# Patient Record
Sex: Female | Born: 1985 | Race: Black or African American | Hispanic: No | Smoking: Former smoker
Health system: Southern US, Community
[De-identification: ages and names within clinical notes are randomized; demographics above are authoritative.]

## PROBLEM LIST (undated history)

## (undated) ENCOUNTER — Ambulatory Visit: Admission: EM | Source: Home / Self Care

## (undated) DIAGNOSIS — G43909 Migraine, unspecified, not intractable, without status migrainosus: Secondary | ICD-10-CM

## (undated) DIAGNOSIS — M797 Fibromyalgia: Secondary | ICD-10-CM

## (undated) DIAGNOSIS — F419 Anxiety disorder, unspecified: Secondary | ICD-10-CM

## (undated) HISTORY — PX: ABDOMINAL SURGERY: SHX537

---

## 2006-06-30 ENCOUNTER — Emergency Department: Payer: Self-pay | Admitting: Emergency Medicine

## 2006-07-19 ENCOUNTER — Emergency Department: Payer: Self-pay | Admitting: Emergency Medicine

## 2006-10-29 ENCOUNTER — Emergency Department: Payer: Self-pay | Admitting: Emergency Medicine

## 2007-05-27 ENCOUNTER — Emergency Department: Payer: Self-pay | Admitting: Emergency Medicine

## 2007-05-30 ENCOUNTER — Emergency Department: Payer: Self-pay | Admitting: Unknown Physician Specialty

## 2007-05-31 ENCOUNTER — Emergency Department: Payer: Self-pay | Admitting: Emergency Medicine

## 2007-06-21 ENCOUNTER — Emergency Department: Payer: Self-pay | Admitting: Emergency Medicine

## 2007-06-22 ENCOUNTER — Emergency Department: Payer: Self-pay

## 2007-09-13 ENCOUNTER — Emergency Department: Payer: Self-pay | Admitting: Emergency Medicine

## 2007-09-19 ENCOUNTER — Emergency Department: Payer: Self-pay | Admitting: Emergency Medicine

## 2007-12-15 ENCOUNTER — Emergency Department: Payer: Self-pay | Admitting: Emergency Medicine

## 2008-11-05 ENCOUNTER — Emergency Department: Payer: Self-pay | Admitting: Emergency Medicine

## 2008-11-23 ENCOUNTER — Emergency Department: Payer: Self-pay | Admitting: Emergency Medicine

## 2009-07-01 ENCOUNTER — Emergency Department: Payer: Self-pay | Admitting: Emergency Medicine

## 2009-08-21 ENCOUNTER — Emergency Department: Payer: Self-pay | Admitting: Unknown Physician Specialty

## 2009-10-15 ENCOUNTER — Emergency Department: Payer: Self-pay | Admitting: Unknown Physician Specialty

## 2010-02-17 ENCOUNTER — Emergency Department: Payer: Self-pay | Admitting: Emergency Medicine

## 2010-10-24 ENCOUNTER — Emergency Department: Payer: Self-pay | Admitting: Emergency Medicine

## 2011-04-20 DIAGNOSIS — J45909 Unspecified asthma, uncomplicated: Secondary | ICD-10-CM | POA: Insufficient documentation

## 2011-04-20 DIAGNOSIS — Z9851 Tubal ligation status: Secondary | ICD-10-CM | POA: Insufficient documentation

## 2012-06-20 DIAGNOSIS — F17200 Nicotine dependence, unspecified, uncomplicated: Secondary | ICD-10-CM | POA: Insufficient documentation

## 2012-07-21 DIAGNOSIS — F410 Panic disorder [episodic paroxysmal anxiety] without agoraphobia: Secondary | ICD-10-CM | POA: Insufficient documentation

## 2012-08-04 DIAGNOSIS — K5900 Constipation, unspecified: Secondary | ICD-10-CM | POA: Insufficient documentation

## 2013-01-29 DIAGNOSIS — F988 Other specified behavioral and emotional disorders with onset usually occurring in childhood and adolescence: Secondary | ICD-10-CM | POA: Insufficient documentation

## 2013-02-02 DIAGNOSIS — K089 Disorder of teeth and supporting structures, unspecified: Secondary | ICD-10-CM | POA: Insufficient documentation

## 2013-05-25 DIAGNOSIS — F419 Anxiety disorder, unspecified: Secondary | ICD-10-CM | POA: Insufficient documentation

## 2013-05-25 DIAGNOSIS — F331 Major depressive disorder, recurrent, moderate: Secondary | ICD-10-CM | POA: Insufficient documentation

## 2013-05-25 DIAGNOSIS — F411 Generalized anxiety disorder: Secondary | ICD-10-CM | POA: Insufficient documentation

## 2013-07-19 DIAGNOSIS — J309 Allergic rhinitis, unspecified: Secondary | ICD-10-CM | POA: Insufficient documentation

## 2013-07-25 DIAGNOSIS — R87613 High grade squamous intraepithelial lesion on cytologic smear of cervix (HGSIL): Secondary | ICD-10-CM | POA: Insufficient documentation

## 2016-06-06 DIAGNOSIS — F410 Panic disorder [episodic paroxysmal anxiety] without agoraphobia: Secondary | ICD-10-CM | POA: Insufficient documentation

## 2017-10-14 DIAGNOSIS — D219 Benign neoplasm of connective and other soft tissue, unspecified: Secondary | ICD-10-CM | POA: Insufficient documentation

## 2017-10-14 DIAGNOSIS — R102 Pelvic and perineal pain: Secondary | ICD-10-CM | POA: Insufficient documentation

## 2018-05-10 ENCOUNTER — Other Ambulatory Visit: Payer: Self-pay

## 2018-05-10 ENCOUNTER — Encounter: Payer: Self-pay | Admitting: Emergency Medicine

## 2018-05-10 ENCOUNTER — Ambulatory Visit
Admission: EM | Admit: 2018-05-10 | Discharge: 2018-05-10 | Disposition: A | Discharge status: Home / Self Care | Payer: Medicaid Other | Attending: Emergency Medicine | Admitting: Emergency Medicine

## 2018-05-10 ENCOUNTER — Ambulatory Visit: Payer: Medicaid Other

## 2018-05-10 DIAGNOSIS — Z79899 Other long term (current) drug therapy: Secondary | ICD-10-CM | POA: Insufficient documentation

## 2018-05-10 DIAGNOSIS — S7001XA Contusion of right hip, initial encounter: Secondary | ICD-10-CM | POA: Diagnosis not present

## 2018-05-10 DIAGNOSIS — M7989 Other specified soft tissue disorders: Secondary | ICD-10-CM | POA: Diagnosis present

## 2018-05-10 DIAGNOSIS — F17210 Nicotine dependence, cigarettes, uncomplicated: Secondary | ICD-10-CM | POA: Insufficient documentation

## 2018-05-10 DIAGNOSIS — Z23 Encounter for immunization: Secondary | ICD-10-CM

## 2018-05-10 DIAGNOSIS — M25551 Pain in right hip: Secondary | ICD-10-CM | POA: Diagnosis present

## 2018-05-10 DIAGNOSIS — X58XXXA Exposure to other specified factors, initial encounter: Secondary | ICD-10-CM

## 2018-05-10 HISTORY — DX: Migraine, unspecified, not intractable, without status migrainosus: G43.909

## 2018-05-10 HISTORY — DX: Anxiety disorder, unspecified: F41.9

## 2018-05-10 MED ORDER — ACETAMINOPHEN 500 MG PO TABS
1000.0000 mg | ORAL_TABLET | Freq: Once | ORAL | Status: AC
  Administered 2018-05-10: 1000 mg via ORAL

## 2018-05-10 MED ORDER — KETOROLAC TROMETHAMINE 60 MG/2ML IM SOLN
30.0000 mg | Freq: Once | INTRAMUSCULAR | Status: AC
  Administered 2018-05-10: 30 mg via INTRAMUSCULAR

## 2018-05-10 MED ORDER — CEPHALEXIN 500 MG PO CAPS: 1000.0000 mg | ORAL_CAPSULE | Freq: Two times a day (BID) | ORAL | 0 refills | Status: AC

## 2018-05-10 MED ORDER — HYDROCODONE-ACETAMINOPHEN 5-325 MG PO TABS: 1.0000 | ORAL_TABLET | Freq: Four times a day (QID) | ORAL | 0 refills | Status: DC | PRN

## 2018-05-10 MED ORDER — IBUPROFEN 600 MG PO TABS: 600.0000 mg | ORAL_TABLET | Freq: Four times a day (QID) | ORAL | 0 refills | Status: DC | PRN

## 2018-05-10 MED ORDER — MUPIROCIN 2 % EX OINT: 1.0000 "application " | TOPICAL_OINTMENT | Freq: Three times a day (TID) | CUTANEOUS | 0 refills | Status: DC

## 2018-05-10 MED ORDER — TETANUS-DIPHTH-ACELL PERTUSSIS 5-2.5-18.5 LF-MCG/0.5 IM SUSP
0.5000 mL | Freq: Once | INTRAMUSCULAR | Status: AC
  Administered 2018-05-10: 0.5 mL via INTRAMUSCULAR

## 2018-05-10 NOTE — Discharge Instructions (Addendum)
Your x-ray was negative for fracture. This may hurt for some time.  Go immediately to the ER for fevers above 100.4, for pain not controlled with the Tylenol/ibuprofen or Norco/ibuprofen combination, or for any other concerns.  Keflex and Bactroban will help with any skin infection.  600 mg of ibuprofen combined with a Tylenol containing product together 3 or 4 times a day as needed for pain.  Can do 600 mg of ibuprofen combined with the thousand milligrams, or 2 extra strength Tylenols.  Or, you may take the 600 mg of ibuprofen with 1 or 2 of the Norco/Vicodin.  Do not take the Tylenol and Vicodin is to both of Tylenol in them.   Here is a list of primary care providers who are taking new patients:  Dr. Elizabeth Sauer, Dr. Schuyler Amor [address] [place] [address] [phone]  [facility] [address] [place] [address]  [phone]  [facility] [address] [place], [place] [address] [phone]  [facility] [address] [place]  [phone] [place], [place] [address]  Here are clinics/ other resources who will see you if you do not have insurance. Some have certain criteria that you must meet. Call them and find out what they are:  [facility]: [address], [place], [place] [address] Phone: [phone] Hours: First and Third Saturdays of each Month, 9 a.m. - 1 p.m.  [facility]: [address], [address] [place], [place] [address] Phone: [phone] Hours: Tuesday, 4 p.m. - 8 p.m. Thursday, 1 p.m. - 8 p.m. Wednesday, 9 a.m. - [facility] [address], [place], [place] [address] Phone: [phone] Pharmacy Phone Number: [phone] Dental Phone Number: [phone] [facility] Insurance Help: [phone]  Dental Hours: Monday - Thursday, 8 a.m. - 6 p.m.  [facility] [address] [place], [place] [address] Phone: [phone] Pharmacy Phone  Number: [phone] [facility] Insurance Help: [phone]  [facility] [address] [place] [place], [place] [address] Phone: [phone] Pharmacy Phone Number: [phone] [facility] Insurance Help: [phone]  [facility] [address] [place], [place] [address] Phone: [phone] [facility] Insurance Help: [phone]   [facility] [address], [place], [place] [address] Phone: [phone]  Go to www.goodrx.com to look up your medications. This will give you a list of where you can find your prescriptions at the most affordable prices. Or ask the pharmacist what the cash price is, or if they have any other discount programs available to help make your medication more affordable. This can be less expensive than what you would pay with insurance.
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
## 2019-01-20 ENCOUNTER — Other Ambulatory Visit: Payer: Self-pay

## 2019-01-20 ENCOUNTER — Ambulatory Visit: Payer: Medicaid Other

## 2019-01-20 ENCOUNTER — Ambulatory Visit
Admission: EM | Admit: 2019-01-20 | Discharge: 2019-01-20 | Disposition: A | Discharge status: Home / Self Care | Payer: Medicaid Other | Attending: Family Medicine | Admitting: Family Medicine

## 2019-01-20 ENCOUNTER — Encounter: Payer: Self-pay | Admitting: Emergency Medicine

## 2019-01-20 DIAGNOSIS — W010XXA Fall on same level from slipping, tripping and stumbling without subsequent striking against object, initial encounter: Secondary | ICD-10-CM

## 2019-01-20 DIAGNOSIS — M25511 Pain in right shoulder: Secondary | ICD-10-CM | POA: Insufficient documentation

## 2019-01-20 MED ORDER — OXYCODONE-ACETAMINOPHEN 5-325 MG PO TABS: 1.0000 | ORAL_TABLET | Freq: Four times a day (QID) | ORAL | 0 refills | Status: DC | PRN

## 2019-01-20 MED ORDER — MELOXICAM 15 MG PO TABS: 15.0000 mg | ORAL_TABLET | Freq: Every day | ORAL | 0 refills | Status: DC | PRN
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
## 2019-01-20 NOTE — ED Notes (Signed)
Pt placed in sling and felt some relief.

## 2019-01-20 NOTE — Discharge Instructions (Addendum)
Take medication as prescribed. Rest. Drink plenty of fluids. Ice. Use sling only as needed and not more than a few hours during the day. Exercises as discussed, are very important.   Follow-up with orthopedic in 1 week as needed for continued pain.  See above to call to schedule.  Follow up with your primary care physician this week as needed. Return to Urgent care for new or worsening concerns.

## 2019-01-20 NOTE — ED Provider Notes (Signed)
[facility] ____________________________________________  Time seen: Approximately 10:48 AM  I have reviewed the triage vital signs and the nursing notes.   HISTORY  Chief Complaint Shoulder Pain (right) and Fall   HPI Jasmin Robinson is a 33 y.o. female presenting for evaluation of right shoulder pain.  Patient reports 2 weeks ago she was hanging curtains in her house and slipped and fell catching herself with her right shoulder.  States she has had pain since then, but had gotten somewhat better until she had another fall.  States 2 days ago she was walking outside and stepped on a piece of wood that was slick causing her to slip and fall.  States that she fell catching herself with her right arm and shoulder.  Reports she has had increased pain to her right shoulder since.  States overall she has good range of motion but not as good as normal.  States that she is left-hand dominant.  Hard time sleeping due to the pain.  Denies alleviating measures.  Movement increases pain.  Denies history of similar in the past.  Denies other lasting injury from falls.  Reports otherwise doing well.  Denies chest pain, shortness of breath, cough, fevers or recent sickness.  Patient's last menstrual period was 01/06/2019 (approximate).Denies pregnancy.   Past Medical History:  Diagnosis Date  . Anxiety   . Migraines     There are no active problems to display for this patient.   Past Surgical History:  Procedure Laterality Date  . CESAREAN SECTION       No current facility-administered medications for this encounter.   Current Outpatient Medications:  .  albuterol (PROVENTIL HFA;VENTOLIN HFA) 108 (90 Base) MCG/ACT inhaler, Inhale into the lungs every 6 (six) hours as needed for wheezing or shortness of breath., Disp: , Rfl:  .  ibuprofen (ADVIL,MOTRIN) 600 MG tablet, Take 1 tablet (600 mg total) by mouth every 6 (six) hours as needed., Disp: 30 tablet, Rfl: 0 .  meloxicam (MOBIC)  15 MG tablet, Take 1 tablet (15 mg total) by mouth daily as needed for pain., Disp: 14 tablet, Rfl: 0 .  oxyCODONE-acetaminophen (PERCOCET/ROXICET) 5-325 MG tablet, Take 1 tablet by mouth every 6 (six) hours as needed for severe pain. Do not drive while taking as can cause drowsiness., Disp: 10 tablet, Rfl: 0  Allergies Patient has no known allergies.  History reviewed. No pertinent family history.  Social History Social History   Tobacco Use  . Smoking status: Current Every Day Smoker    Types: Cigarettes  . Smokeless tobacco: Never Used  Substance Use Topics  . Alcohol use: Not Currently  . Drug use: Never    Review of Systems Constitutional: No fever ENT: No sore throat. Cardiovascular: Denies chest pain. Respiratory: Denies shortness of breath. Gastrointestinal: No abdominal pain.   Musculoskeletal: Positive right shoulder pain.  Negative for back pain. Skin: Negative for rash.   ____________________________________________   PHYSICAL EXAM:  VITAL SIGNS: ED Triage Vitals  Enc Vitals Group     BP 01/20/19 1017 119/70     Pulse Rate 01/20/19 1017 90     Resp 01/20/19 1017 16     Temp 01/20/19 1017 98.1 F (36.7 C)     Temp Source 01/20/19 1017 Oral     SpO2 01/20/19 1017 99 %     Weight 01/20/19 1014 179 lb 14.3 oz (81.6 kg)     Height 01/20/19 1014 5\' 7"  (1.702 m)     Head Circumference --  Peak Flow --      Pain Score 01/20/19 1014 9     Pain Loc --      Pain Edu? --      Excl. in [place]? --     Constitutional: Alert and oriented. Well appearing and in no acute distress. ENT      Head: Normocephalic and atraumatic. Cardiovascular: Normal rate, regular rhythm. Grossly normal heart sounds.  Good peripheral circulation. Respiratory: Normal respiratory effort without tachypnea nor retractions. Breath sounds are clear and equal bilaterally. No wheezes, rales, rhonchi. Musculoskeletal: No midline cervical, thoracic or lumbar tenderness to palpation. Bilateral  distal radial pulses equal and easily palpated.   Except: Right shoulder diffuse tenderness to palpation, no ecchymosis, no clear point bony tenderness, pain with right abduction and able to lift to shoulder height, negative drop arm test, positive empty can test, right upper extremity otherwise nontender.  No paresthesias. Neurologic:  Normal speech and language.Speech is normal. No gait instability.  Skin:  Skin is warm, dry Psychiatric: Mood and affect are normal. Speech and behavior are normal. Patient exhibits appropriate insight and judgment   ___________________________________________   LABS (all labs ordered are listed, but only abnormal results are displayed)  Labs Reviewed - No data to display  RADIOLOGY  Dg Shoulder Right  Result Date: 01/20/2019 CLINICAL DATA:  Pain following fall several days prior EXAM: RIGHT SHOULDER - 2+ VIEW COMPARISON:  None. FINDINGS: Frontal, oblique, and Y scapular images obtained. No fracture or dislocation evident. Joint spaces appear normal. No erosive change or intra-articular calcification. Visualized right lung clear. IMPRESSION: No fracture or dislocation.  No evident arthropathy. Electronically Signed   By: Lowella Grip III M.D.   On: 01/20/2019 11:08   ____________________________________________   PROCEDURES   INITIAL IMPRESSION / ASSESSMENT AND PLAN / ED COURSE  Pertinent labs & imaging results that were available during my care of the patient were reviewed by me and considered in my medical decision making (see chart for details).  Well-appearing patient.  No acute distress.  Right shoulder pain post 2 mechanical falls.  Right shoulder x-ray as above per radiologist, negative.  Can say and of sprain versus partial tear.  Patient requests shoulder sling, given, however counseled regarding limited use of this and making sure she has frequent active range of motion.  Range of motion exercises including pendulum discussed and  demonstrated.  Ice, rest.  Mobic and PRN Percocet as needed for breakthrough pain.  Follow-up with orthopedic this coming week.Discussed indication, risks and benefits of medications with patient.  Discussed follow up with Primary care physician this week. Discussed follow up and return parameters including no resolution or any worsening concerns. Patient verbalized understanding and agreed to plan.    [place] controlled substance database reviewed, no recent controlled substances documented.  ____________________________________________   FINAL CLINICAL IMPRESSION(S) / ED DIAGNOSES  Final diagnoses:  Acute pain of right shoulder     ED Discharge Orders         Ordered    meloxicam (MOBIC) 15 MG tablet  Daily PRN     01/20/19 1111    oxyCODONE-acetaminophen (PERCOCET/ROXICET) 5-325 MG tablet  Every 6 hours PRN     01/20/19 1111           Note: This dictation was prepared with Dragon dictation along with smaller phrase technology. Any transcriptional errors that result from this process are unintentional.         Marylene Land, NP 01/20/19 1117

## 2019-03-29 DIAGNOSIS — R739 Hyperglycemia, unspecified: Secondary | ICD-10-CM | POA: Insufficient documentation

## 2019-03-29 DIAGNOSIS — E663 Overweight: Secondary | ICD-10-CM | POA: Insufficient documentation

## 2019-03-29 DIAGNOSIS — R350 Frequency of micturition: Secondary | ICD-10-CM | POA: Insufficient documentation

## 2019-03-29 DIAGNOSIS — M549 Dorsalgia, unspecified: Secondary | ICD-10-CM | POA: Insufficient documentation

## 2019-04-29 ENCOUNTER — Other Ambulatory Visit: Payer: Self-pay

## 2019-04-29 ENCOUNTER — Ambulatory Visit: Payer: Medicaid Other

## 2019-04-29 ENCOUNTER — Encounter: Payer: Self-pay | Admitting: Emergency Medicine

## 2019-04-29 ENCOUNTER — Ambulatory Visit
Admission: EM | Admit: 2019-04-29 | Discharge: 2019-04-29 | Disposition: A | Discharge status: Home / Self Care | Payer: Medicaid Other | Attending: Family Medicine | Admitting: Family Medicine

## 2019-04-29 DIAGNOSIS — S39012A Strain of muscle, fascia and tendon of lower back, initial encounter: Secondary | ICD-10-CM | POA: Diagnosis not present

## 2019-04-29 DIAGNOSIS — W19XXXA Unspecified fall, initial encounter: Secondary | ICD-10-CM

## 2019-04-29 DIAGNOSIS — S86911A Strain of unspecified muscle(s) and tendon(s) at lower leg level, right leg, initial encounter: Secondary | ICD-10-CM | POA: Diagnosis present

## 2019-04-29 MED ORDER — CYCLOBENZAPRINE HCL 10 MG PO TABS: 10.0000 mg | ORAL_TABLET | Freq: Every day | ORAL | 0 refills | Status: DC

## 2019-04-29 MED ORDER — MELOXICAM 15 MG PO TABS: 15.0000 mg | ORAL_TABLET | Freq: Every day | ORAL | 0 refills | Status: DC | PRN

## 2019-04-29 MED ORDER — ACETAMINOPHEN 500 MG PO TABS
1000.0000 mg | ORAL_TABLET | Freq: Once | ORAL | Status: AC
  Administered 2019-04-29: 1000 mg via ORAL

## 2019-04-29 NOTE — Discharge Instructions (Signed)
Rest, ice/heat, over the counter tylenol 

## 2019-04-29 NOTE — ED Triage Notes (Signed)
Patient states that she fell last night.  Patient c/o right knee pain and lower back pain.

## 2019-04-29 NOTE — ED Provider Notes (Signed)
[facility]    CSN: [id number] Arrival date & time: 04/29/19  1447      History   Chief Complaint Chief Complaint  Patient presents with  . Knee Pain    right  . Back Pain    HPI Jasmin Robinson is a 33 y.o. female.   33 yo female with a c/o low back pain and right knee pain since falling last night. Denies any numbness/tingling, bowel/bladder problems.      Past Medical History:  Diagnosis Date  . Anxiety   . Migraines     There are no active problems to display for this patient.   Past Surgical History:  Procedure Laterality Date  . CESAREAN SECTION      OB History   No obstetric history on file.      Home Medications    Prior to Admission medications   Medication Sig Start Date End Date Taking? Authorizing Provider  albuterol (PROVENTIL HFA;VENTOLIN HFA) 108 (90 Base) MCG/ACT inhaler Inhale into the lungs every 6 (six) hours as needed for wheezing or shortness of breath.   Yes [provider]  cyclobenzaprine (FLEXERIL) 10 MG tablet Take 1 tablet (10 mg total) by mouth at bedtime. 04/29/19   Norval Gable, MD  ibuprofen (ADVIL,MOTRIN) 600 MG tablet Take 1 tablet (600 mg total) by mouth every 6 (six) hours as needed. 05/10/18   Melynda Ripple, MD  meloxicam (MOBIC) 15 MG tablet Take 1 tablet (15 mg total) by mouth daily as needed for pain. 04/29/19   Norval Gable, MD  oxyCODONE-acetaminophen (PERCOCET/ROXICET) 5-325 MG tablet Take 1 tablet by mouth every 6 (six) hours as needed for severe pain. Do not drive while taking as can cause drowsiness. 01/20/19   Marylene Land, NP  citalopram (CELEXA) 10 MG tablet Take 10 mg by mouth daily.  01/20/19  [provider]  topiramate (TOPAMAX) 25 MG capsule Take 25 mg by mouth 2 (two) times daily.  01/20/19  [provider]    Family History History reviewed. No pertinent family history.  Social History Social History   Tobacco Use  . Smoking status: Current Every Day  Smoker    Types: Cigarettes  . Smokeless tobacco: Never Used  Substance Use Topics  . Alcohol use: Not Currently  . Drug use: Never     Allergies   Patient has no known allergies.   Review of Systems Review of Systems   Physical Exam Triage Vital Signs ED Triage Vitals  Enc Vitals Group     BP 04/29/19 1501 119/82     Pulse Rate 04/29/19 1501 79     Resp 04/29/19 1501 16     Temp 04/29/19 1501 98.5 F (36.9 C)     Temp Source 04/29/19 1501 Oral     SpO2 04/29/19 1501 100 %     Weight 04/29/19 1458 180 lb (81.6 kg)     Height 04/29/19 1458 5\' 7"  (1.702 m)     Head Circumference --      Peak Flow --      Pain Score 04/29/19 1458 10     Pain Loc --      Pain Edu? --      Excl. in [place]? --    No data found.  Updated Vital Signs BP 119/82 (BP Location: Left Arm)   Pulse 79   Temp 98.5 F (36.9 C) (Oral)   Resp 16   Ht 5\' 7"  (1.702 m)   Wt 81.6 kg  LMP 04/15/2019 (Approximate)   SpO2 100%   BMI 28.19 kg/m   Visual Acuity Right Eye Distance:   Left Eye Distance:   Bilateral Distance:    Right Eye Near:   Left Eye Near:    Bilateral Near:     Physical Exam Vitals signs and nursing note reviewed.  Constitutional:      General: She is not in acute distress.    Appearance: She is not toxic-appearing or diaphoretic.  Musculoskeletal:     Right shoulder: She exhibits tenderness, bony tenderness and spasm. She exhibits normal range of motion, no swelling, no effusion, no crepitus, no deformity, no laceration, no pain, normal pulse and normal strength.     Right knee: She exhibits bony tenderness. She exhibits normal range of motion, no swelling, no effusion, no ecchymosis, no deformity, no laceration, no erythema, normal alignment, no LCL laxity, normal patellar mobility, normal meniscus and no MCL laxity. Tenderness (general) found.     Lumbar back: She exhibits tenderness and spasm.  Neurological:     Mental Status: She is alert.      UC Treatments /  Results  Labs (all labs ordered are listed, but only abnormal results are displayed) Labs Reviewed - No data to display  EKG   Radiology No results found.  Procedures Procedures (including critical care time)  Medications Ordered in UC Medications  acetaminophen (TYLENOL) tablet 1,000 mg (1,000 mg Oral Given 04/29/19 1634)    Initial Impression / Assessment and Plan / UC Course  I have reviewed the triage vital signs and the nursing notes.  Pertinent labs & imaging results that were available during my care of the patient were reviewed by me and considered in my medical decision making (see chart for details).      Final Clinical Impressions(s) / UC Diagnoses   Final diagnoses:  Strain of right knee, initial encounter  Strain of lumbar region, initial encounter     Discharge Instructions     Rest, ice/heat, over the counter tylenol    ED Prescriptions    Medication Sig Dispense Auth. Provider   meloxicam (MOBIC) 15 MG tablet Take 1 tablet (15 mg total) by mouth daily as needed for pain. 14 tablet Luverne Farone, Pamala Hurry, MD   cyclobenzaprine (FLEXERIL) 10 MG tablet Take 1 tablet (10 mg total) by mouth at bedtime. 30 tablet Payton Mccallum, MD      1. x-ray results and diagnosis reviewed with patient 2. rx as per orders above; reviewed possible side effects, interactions, risks and benefits  3. Recommend supportive treatment as above 4. Follow-up prn if symptoms worsen or don't improve  PDMP not reviewed this encounter.   Payton Mccallum, MD 05/01/19 2051

## 2019-04-29 NOTE — ED Notes (Signed)
Ice pack applied to right knee

## 2020-04-07 ENCOUNTER — Ambulatory Visit
Admission: EM | Admit: 2020-04-07 | Discharge: 2020-04-07 | Disposition: A | Discharge status: Home / Self Care | Payer: Medicaid Other | Attending: Internal Medicine | Admitting: Internal Medicine

## 2020-04-07 ENCOUNTER — Encounter: Payer: Self-pay | Admitting: Emergency Medicine

## 2020-04-07 ENCOUNTER — Other Ambulatory Visit: Payer: Self-pay

## 2020-04-07 DIAGNOSIS — K921 Melena: Secondary | ICD-10-CM | POA: Diagnosis not present

## 2020-04-07 DIAGNOSIS — R110 Nausea: Secondary | ICD-10-CM | POA: Insufficient documentation

## 2020-04-07 DIAGNOSIS — R197 Diarrhea, unspecified: Secondary | ICD-10-CM | POA: Insufficient documentation

## 2020-04-07 DIAGNOSIS — K529 Noninfective gastroenteritis and colitis, unspecified: Secondary | ICD-10-CM | POA: Insufficient documentation

## 2020-04-07 LAB — OCCULT BLOOD X 1 CARD TO LAB, STOOL: Fecal Occult Bld: NEGATIVE

## 2020-04-07 LAB — CBC WITH DIFFERENTIAL/PLATELET
Abs Immature Granulocytes: 0.03 10*3/uL (ref 0.00–0.07)
Basophils Absolute: 0.1 10*3/uL (ref 0.0–0.1)
Basophils Relative: 1 %
Eosinophils Absolute: 0.6 10*3/uL — ABNORMAL HIGH (ref 0.0–0.5)
Eosinophils Relative: 8 %
HCT: 36.1 % (ref 36.0–46.0)
Hemoglobin: 11.4 g/dL — ABNORMAL LOW (ref 12.0–15.0)
Immature Granulocytes: 0 %
Lymphocytes Relative: 23 %
Lymphs Abs: 1.7 10*3/uL (ref 0.7–4.0)
MCH: 27.4 pg (ref 26.0–34.0)
MCHC: 31.6 g/dL (ref 30.0–36.0)
MCV: 86.8 fL (ref 80.0–100.0)
Monocytes Absolute: 0.7 10*3/uL (ref 0.1–1.0)
Monocytes Relative: 9 %
Neutro Abs: 4.3 10*3/uL (ref 1.7–7.7)
Neutrophils Relative %: 59 %
Platelets: 242 10*3/uL (ref 150–400)
RBC: 4.16 MIL/uL (ref 3.87–5.11)
RDW: 14.3 % (ref 11.5–15.5)
WBC: 7.3 10*3/uL (ref 4.0–10.5)
nRBC: 0 % (ref 0.0–0.2)

## 2020-04-07 LAB — COMPREHENSIVE METABOLIC PANEL
ALT: 15 U/L (ref 0–44)
AST: 14 U/L — ABNORMAL LOW (ref 15–41)
Albumin: 3.5 g/dL (ref 3.5–5.0)
Alkaline Phosphatase: 49 U/L (ref 38–126)
Anion gap: 6 (ref 5–15)
BUN: 15 mg/dL (ref 6–20)
CO2: 26 mmol/L (ref 22–32)
Calcium: 8.5 mg/dL — ABNORMAL LOW (ref 8.9–10.3)
Chloride: 105 mmol/L (ref 98–111)
Creatinine, Ser: 0.61 mg/dL (ref 0.44–1.00)
GFR calc Af Amer: >60 mL/min (ref >60)
GFR calc non Af Amer: >60 mL/min (ref >60)
Glucose, Bld: 103 mg/dL — ABNORMAL HIGH (ref 70–99)
Potassium: 4.3 mmol/L (ref 3.5–5.1)
Sodium: 137 mmol/L (ref 135–145)
Total Bilirubin: 0.7 mg/dL (ref 0.3–1.2)
Total Protein: 6.8 g/dL (ref 6.5–8.1)

## 2020-04-07 MED ORDER — ONDANSETRON HCL 4 MG PO TABS: 4.0000 mg | ORAL_TABLET | Freq: Three times a day (TID) | ORAL | 0 refills | Status: DC | PRN

## 2020-04-07 MED ORDER — HYDROCORTISONE ACETATE 25 MG RE SUPP: 25.0000 mg | Freq: Two times a day (BID) | RECTAL | 0 refills | Status: DC

## 2020-04-07 MED ORDER — ONDANSETRON 8 MG PO TBDP
8.0000 mg | ORAL_TABLET | Freq: Once | ORAL | Status: AC
  Administered 2020-04-07: 8 mg via ORAL

## 2020-04-07 NOTE — ED Provider Notes (Addendum)
[facility]    CSN: [id number] Arrival date & time: 04/07/20  0737      History   Chief Complaint Chief Complaint  Patient presents with  . Diarrhea  . Blood In Stools    HPI Jasmin Robinson is a 34 y.o. female who presents with onset of diarrhea x 2 days. Has seen blood in the toilet. Has also had lower abdominal pain and nausea. She only had 2 BM's, one each day. She had been feeling weak, fatigued before the illness started and thought it was from stress and her fibroids. Her nose has been stuffy from allergies.  She ate hotel breakfast the first day of the bloody stool, then last night had pizza and had urgency to have diarrhea and had blood in the stool again. Does not have hx of hemorrhoids. Has pressure feeling on her anal area today.    Past Medical History:  Diagnosis Date  . Anxiety   . Migraines     There are no problems to display for this patient.   Past Surgical History:  Procedure Laterality Date  . CESAREAN SECTION      OB History   No obstetric history on file.      Home Medications    Prior to Admission medications   Medication Sig Start Date End Date Taking? Authorizing Provider  albuterol (PROVENTIL HFA;VENTOLIN HFA) 108 (90 Base) MCG/ACT inhaler Inhale into the lungs every 6 (six) hours as needed for wheezing or shortness of breath.    [provider]  cyclobenzaprine (FLEXERIL) 10 MG tablet Take 1 tablet (10 mg total) by mouth at bedtime. 04/29/19   Payton Mccallum, MD  ibuprofen (ADVIL,MOTRIN) 600 MG tablet Take 1 tablet (600 mg total) by mouth every 6 (six) hours as needed. 05/10/18   Domenick Gong, MD  meloxicam (MOBIC) 15 MG tablet Take 1 tablet (15 mg total) by mouth daily as needed for pain. 04/29/19   Payton Mccallum, MD  oxyCODONE-acetaminophen (PERCOCET/ROXICET) 5-325 MG tablet Take 1 tablet by mouth every 6 (six) hours as needed for severe pain. Do not drive while taking as can cause drowsiness. 01/20/19   Renford Dills, NP  citalopram (CELEXA) 10 MG tablet Take 10 mg by mouth daily.  01/20/19  [provider]  topiramate (TOPAMAX) 25 MG capsule Take 25 mg by mouth 2 (two) times daily.  01/20/19  [provider]    Family History History reviewed. No pertinent family history.  Social History Social History   Tobacco Use  . Smoking status: Current Every Day Smoker    Types: Cigarettes  . Smokeless tobacco: Never Used  Vaping Use  . Vaping Use: Never used  Substance Use Topics  . Alcohol use: Not Currently  . Drug use: Never     Allergies   Patient has no known allergies.   Review of Systems Review of Systems  Constitutional: Positive for appetite change, diaphoresis and fatigue. Negative for chills and fever.       Has been loosing wt since her appetite is low. Has hx of anemia and is out of her Fe  HENT: Positive for congestion, postnasal drip and rhinorrhea. Negative for ear discharge, ear pain and sore throat.   Eyes: Negative for discharge.  Respiratory: Negative for cough, shortness of breath and wheezing.   Gastrointestinal: Positive for abdominal pain, anal bleeding, blood in stool, diarrhea and nausea. Negative for abdominal distention, constipation and vomiting.       Gets chronic abdominal  bloating, and constipation. Has not seen GI.   Genitourinary: Positive for pelvic pain. Negative for difficulty urinating.  Musculoskeletal: Negative for myalgias.  Skin: Negative for rash.  Neurological: Positive for weakness and light-headedness. Negative for headaches.  Hematological: Negative for adenopathy.     Physical Exam Triage Vital Signs ED Triage Vitals  Enc Vitals Group     BP 04/07/20 1003 122/83     Pulse Rate 04/07/20 1003 62     Resp 04/07/20 1003 18     Temp 04/07/20 1003 97.9 F (36.6 C)     Temp Source 04/07/20 1003 Oral     SpO2 04/07/20 1003 98 %     Weight 04/07/20 0959 179 lb 14.3 oz (81.6 kg)     Height 04/07/20 0959 5\' 7"  (1.702 m)       Head Circumference --      Peak Flow --      Pain Score 04/07/20 0958 8     Pain Loc --      Pain Edu? --      Excl. in [place]? --    No data found.  Updated Vital Signs BP 122/83 (BP Location: Left Arm)   Pulse 62   Temp 97.9 F (36.6 C) (Oral)   Resp 18   Ht 5\' 7"  (1.702 m)   Wt 179 lb 14.3 oz (81.6 kg)   SpO2 98%   BMI 28.18 kg/m   Visual Acuity Right Eye Distance:   Left Eye Distance:   Bilateral Distance:    Right Eye Near:   Left Eye Near:    Bilateral Near:     Physical Exam Vitals and nursing note reviewed.  Constitutional:      General: She is not in acute distress.    Appearance: She is not toxic-appearing.  HENT:     Head: Normocephalic.     Right Ear: Tympanic membrane, ear canal and external ear normal.     Left Ear: Tympanic membrane, ear canal and external ear normal.     Nose: Congestion present.     Mouth/Throat:     Mouth: Mucous membranes are moist.     Pharynx: Oropharynx is clear.  Eyes:     General: No scleral icterus.    Conjunctiva/sclera: Conjunctivae normal.  Cardiovascular:     Rate and Rhythm: Normal rate and regular rhythm.     Heart sounds: No murmur heard.   Abdominal:     General: Bowel sounds are normal. There is no distension.     Palpations: Abdomen is soft. There is no mass.     Tenderness: There is no guarding or rebound.     Comments: Has mild tenderness on RLQ  Genitourinary:    Comments: RECTAL- external area is normal. Digital exam revealed swelling of inner anal area, tenderness and the was reddish matter on my glove, but hemoccult was neg.  Musculoskeletal:        General: Normal range of motion.     Cervical back: Neck supple.  Lymphadenopathy:     Cervical: No cervical adenopathy.  Skin:    General: Skin is warm and dry.     Findings: No rash.  Neurological:     Mental Status: She is alert and oriented to person, place, and time.     Gait: Gait normal.  Psychiatric:        Mood and Affect: Mood normal.         Behavior: Behavior normal.  Thought Content: Thought content normal.        Judgment: Judgment normal.    UC Treatments / Results  Labs (Robinson labs ordered are listed, but only abnormal results are displayed) Labs Reviewed - No data to display  EKG   Radiology No results found.  Procedures Procedures (including critical care time)  Medications Ordered in UC Medications - No data to display  Initial Impression / Assessment and Plan / UC Course  I have reviewed the triage vital signs and the nursing notes. Anal pain with blood in stool, she may have just internal hemorrhoid vs colitis or infectious diarrhea. GI panel ordered due to diarrhea and blood in the stool.  In the mean time I will have her use Anusol suppositories. See instructions.  Needs to Fu with GI Pertinent labs  results that were available during my care of the patient were reviewed by me and considered in my medical decision making (see chart for details).  Final Clinical Impressions(s) / UC Diagnoses   Final diagnoses:  None   Discharge Instructions   None    ED Prescriptions    None     PDMP not reviewed this encounter.   Garey Ham, Cordelia Poche 04/07/20 2110    Rodriguez-Southworth, Nettie Elm, PA-C 04/24/20 1600

## 2020-04-07 NOTE — Discharge Instructions (Addendum)
The stool test does not show any blood right now. Your cell count shows very mild anemia only, and the rest of it is normal.  The Chemistry shows normal liver and kidney function, but your calcium is a little low. So try consuming more foods rich in calcium.  I am ordering stool cultures which you can drop off tomorrow to check for bacterial infection.  Avoids drinks or foods with color red. I will have you try Anusol suppositories to help with the anal pressure you are having which could be from internal hemorrhoids.  Please follow up with your family Dr so you can be sent to a stomach( gastroenterologist) doctor for your deigestion issues.

## 2020-04-07 NOTE — ED Triage Notes (Signed)
Patient given supplies to collect a stool specimen (GI Panel).

## 2020-04-07 NOTE — ED Triage Notes (Signed)
Pt c/o diarrhea, and bloody stools. Started 2 days ago. She states there was a lot of blood in the toilet. She states also has abdominal and nausea.

## 2020-04-08 ENCOUNTER — Other Ambulatory Visit
Admission: RE | Admit: 2020-04-08 | Discharge: 2020-04-08 | Disposition: A | Discharge status: Home / Self Care | Payer: Medicaid Other | Source: Ambulatory Visit | Attending: Internal Medicine | Admitting: Internal Medicine

## 2020-04-08 DIAGNOSIS — R197 Diarrhea, unspecified: Secondary | ICD-10-CM | POA: Insufficient documentation

## 2020-04-09 LAB — GASTROINTESTINAL PANEL BY PCR, STOOL (REPLACES STOOL CULTURE)

## 2020-07-21 DIAGNOSIS — R1084 Generalized abdominal pain: Secondary | ICD-10-CM | POA: Insufficient documentation

## 2020-07-21 DIAGNOSIS — J4520 Mild intermittent asthma, uncomplicated: Secondary | ICD-10-CM | POA: Insufficient documentation

## 2020-07-21 DIAGNOSIS — Z309 Encounter for contraceptive management, unspecified: Secondary | ICD-10-CM | POA: Insufficient documentation

## 2020-07-21 DIAGNOSIS — Z8659 Personal history of other mental and behavioral disorders: Secondary | ICD-10-CM | POA: Insufficient documentation

## 2020-07-21 DIAGNOSIS — K649 Unspecified hemorrhoids: Secondary | ICD-10-CM | POA: Insufficient documentation

## 2020-07-21 DIAGNOSIS — L509 Urticaria, unspecified: Secondary | ICD-10-CM | POA: Insufficient documentation

## 2020-08-28 DIAGNOSIS — L209 Atopic dermatitis, unspecified: Secondary | ICD-10-CM | POA: Insufficient documentation

## 2020-10-02 DIAGNOSIS — R5383 Other fatigue: Secondary | ICD-10-CM | POA: Insufficient documentation

## 2020-10-19 IMAGING — CR DG LUMBAR SPINE COMPLETE 4+V
5 series · 5 of 5 positions shown · non-contrast
Comparison: None.

CLINICAL DATA: Acute low back pain following fall. Initial
encounter.

EXAM:
LUMBAR SPINE - COMPLETE 4+ VIEW

[l-spine ap]
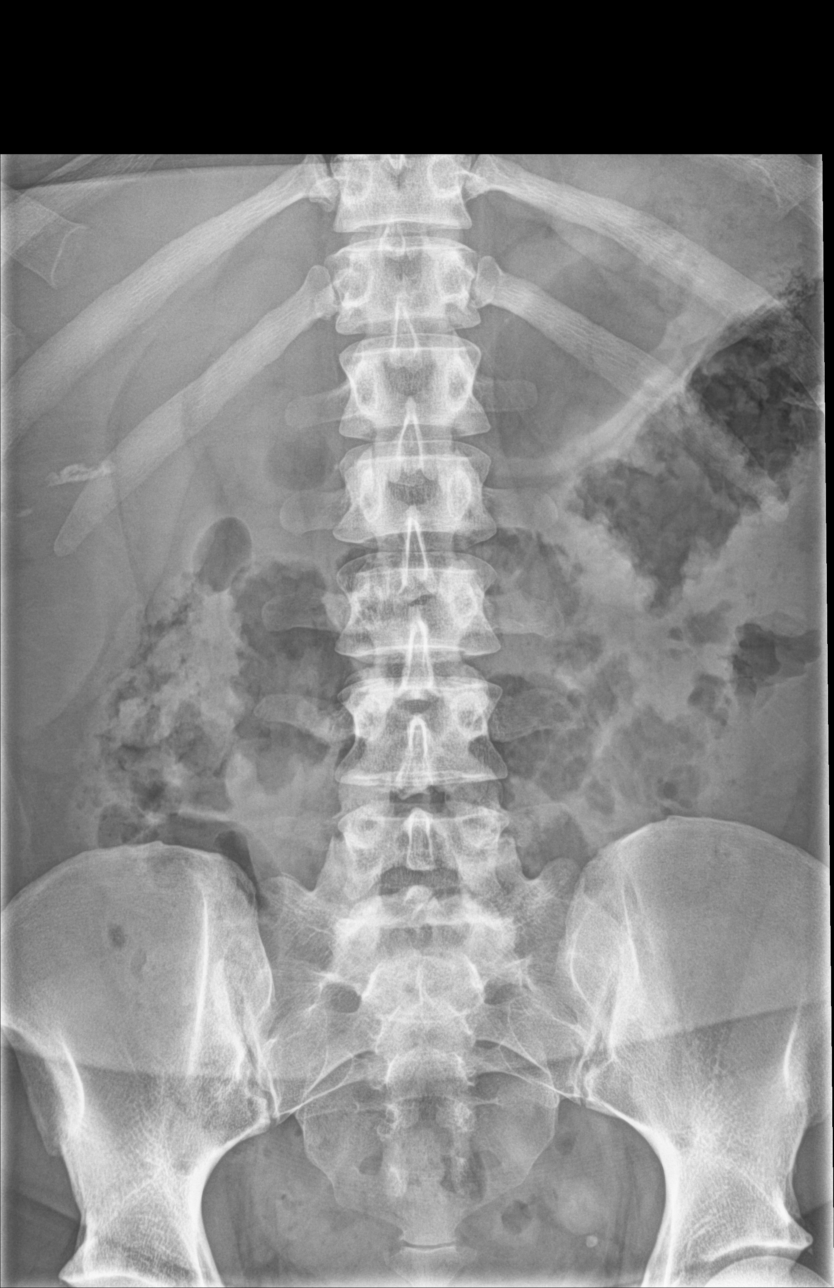

[l-spine obl (1 of 2)]
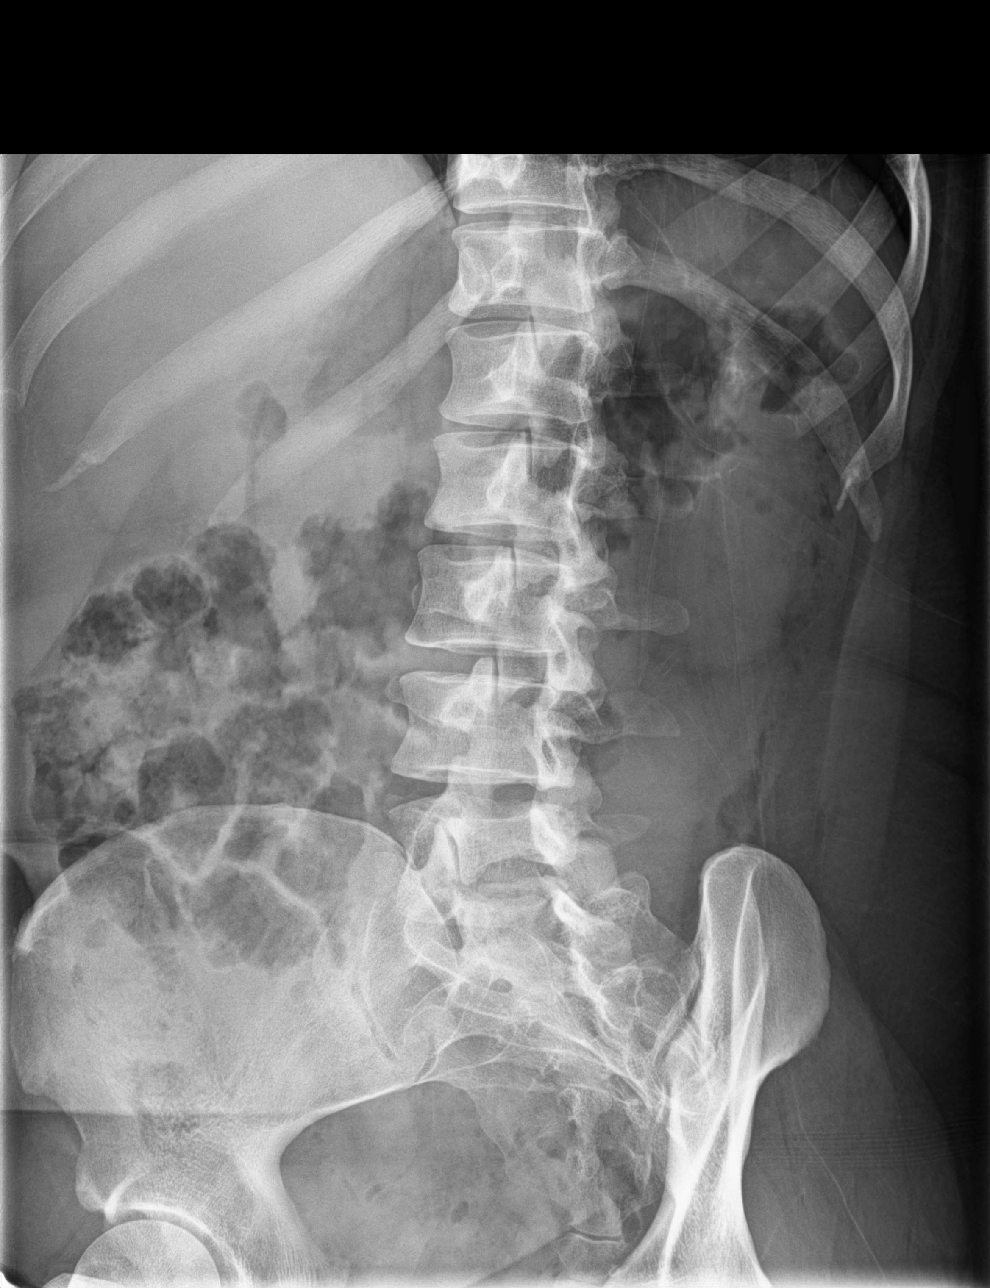

[l-spine obl (2 of 2)]
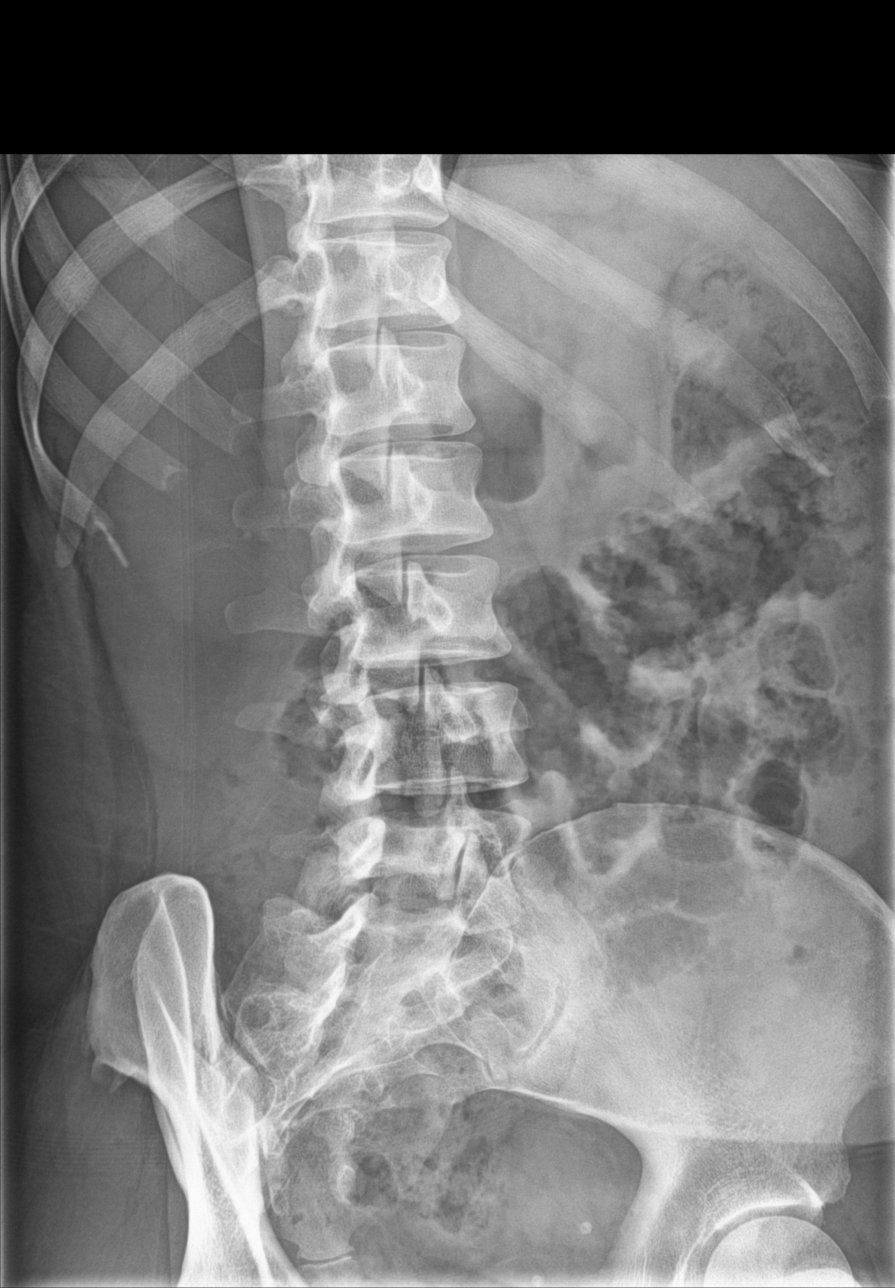

[l-spine lat]
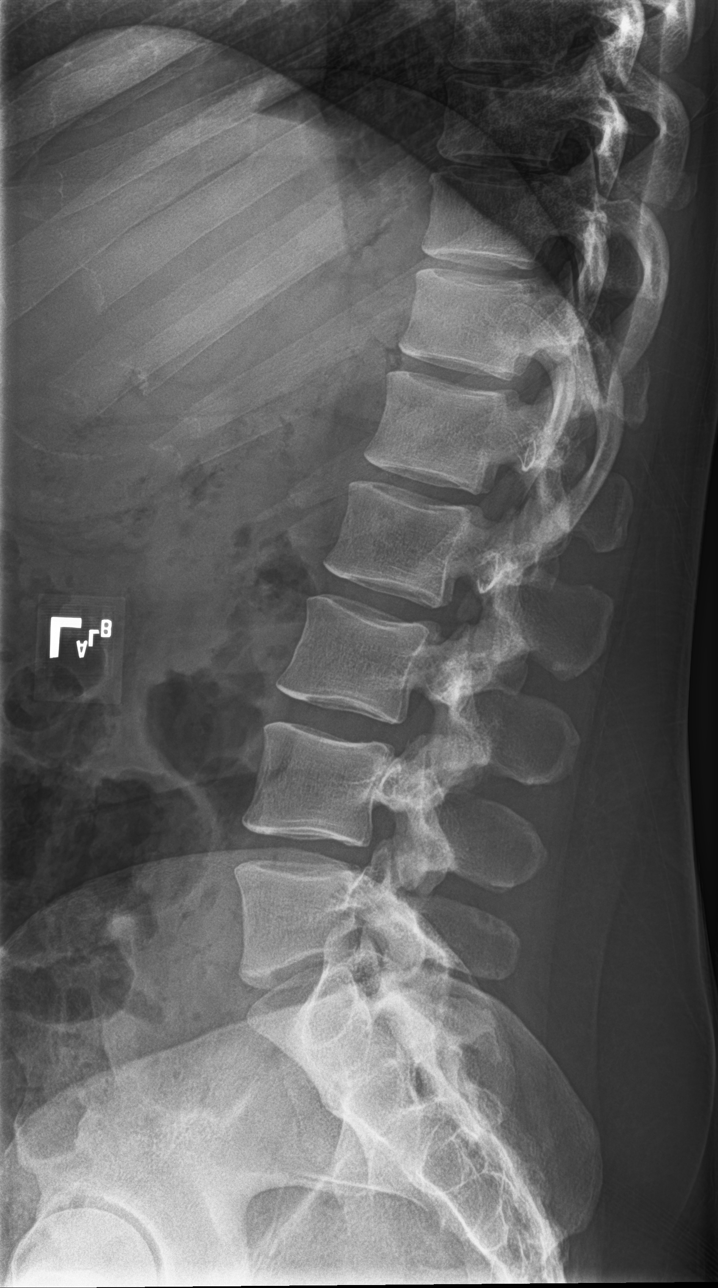

[l-spine spot]
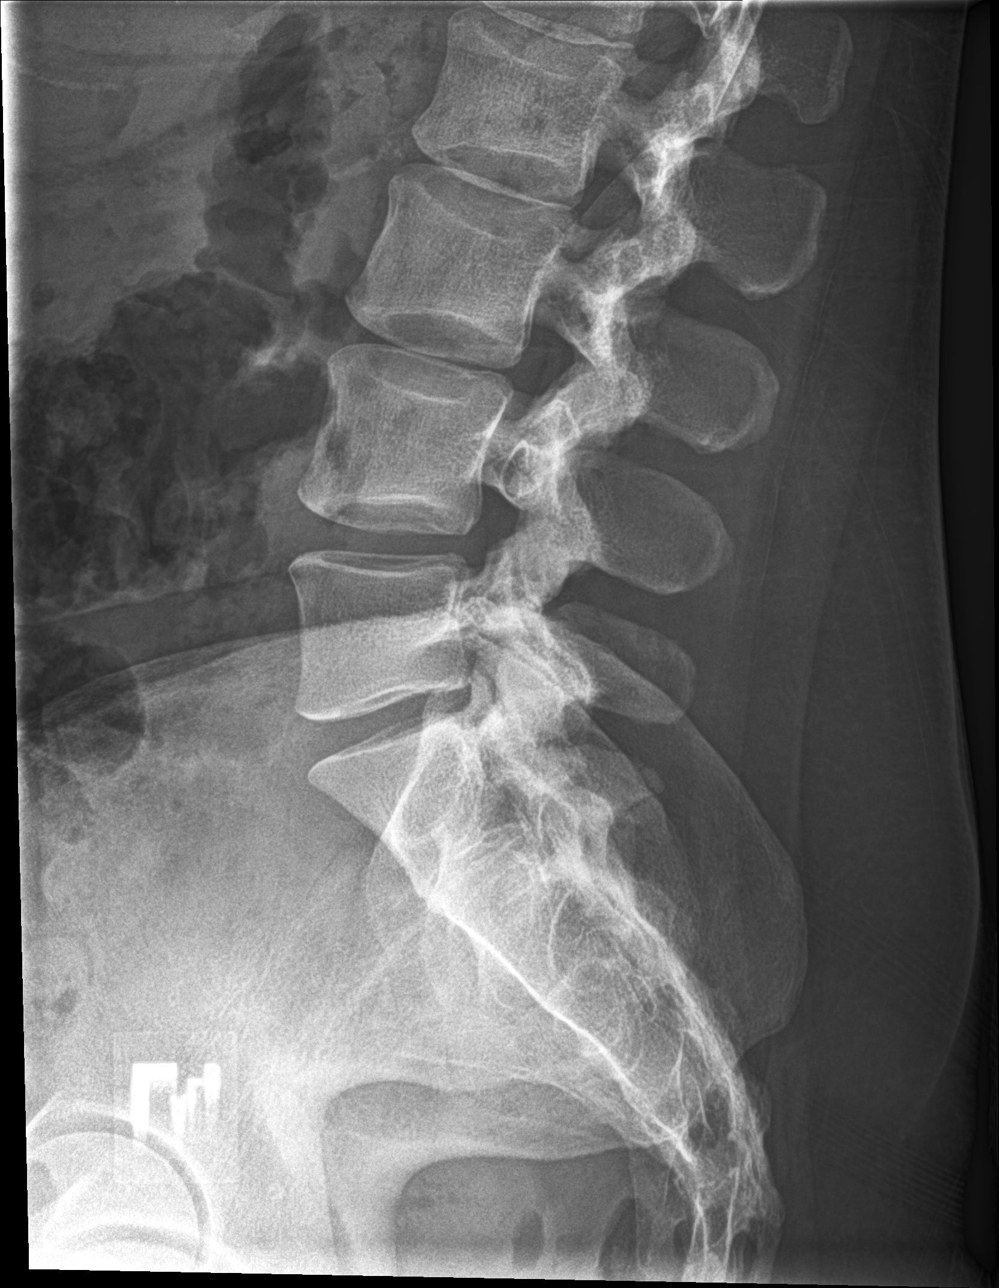

[5 of 5 positions shown; findings below may reference images not displayed]

FINDINGS: There is no evidence of lumbar spine fracture. Alignment is normal.
Intervertebral disc spaces are maintained.
IMPRESSION: Negative.

## 2020-10-19 IMAGING — CR DG KNEE COMPLETE 4+V*R*
4 series · 4 of 4 positions shown · non-contrast
Comparison: None.

CLINICAL DATA: Acute RIGHT knee pain following fall. Initial
encounter.

EXAM:
RIGHT KNEE - COMPLETE 4+ VIEW

[knee ap]
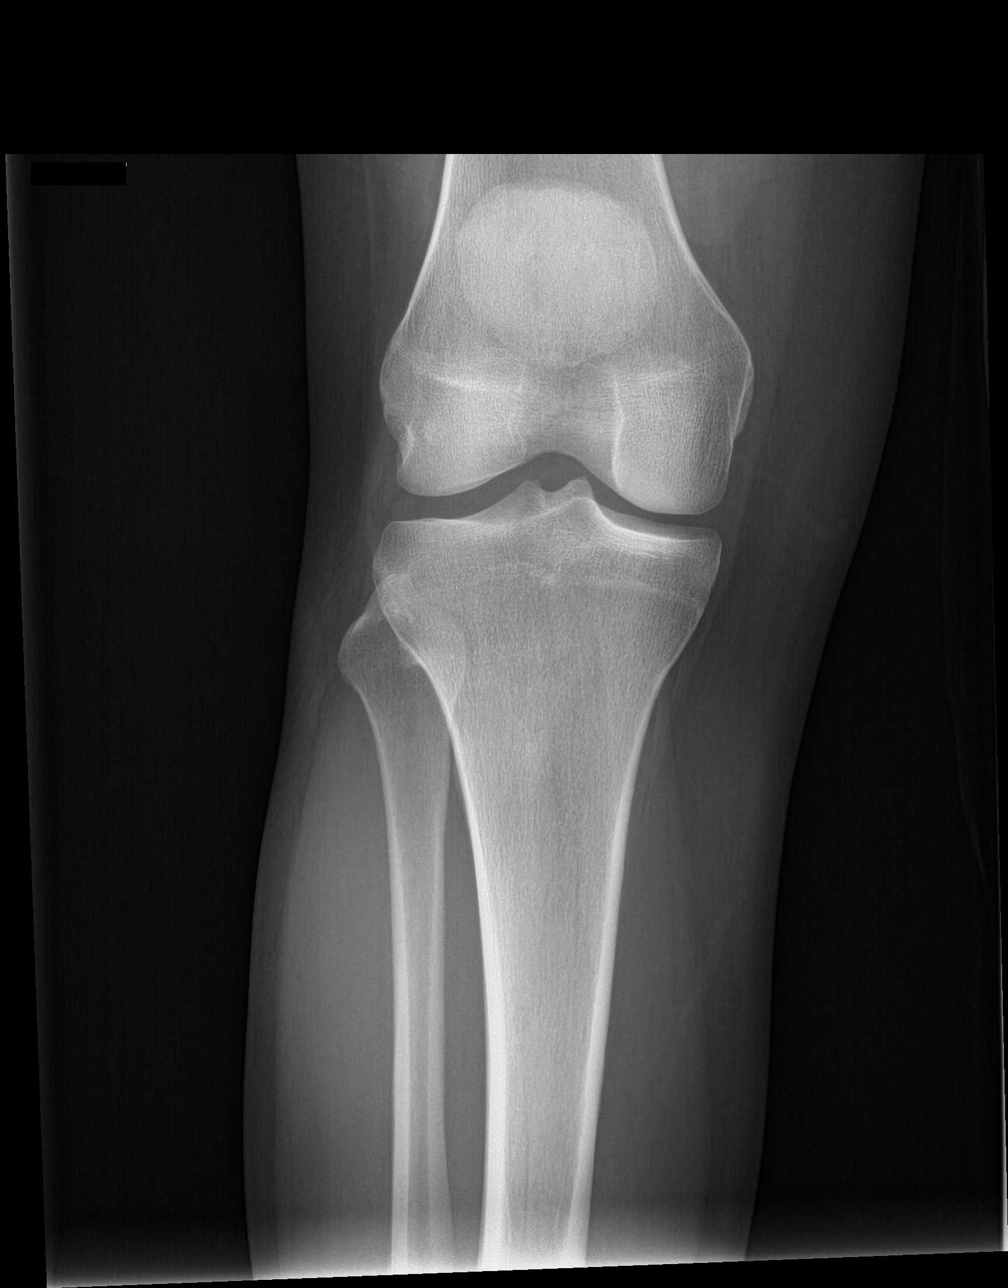

[knee lat]
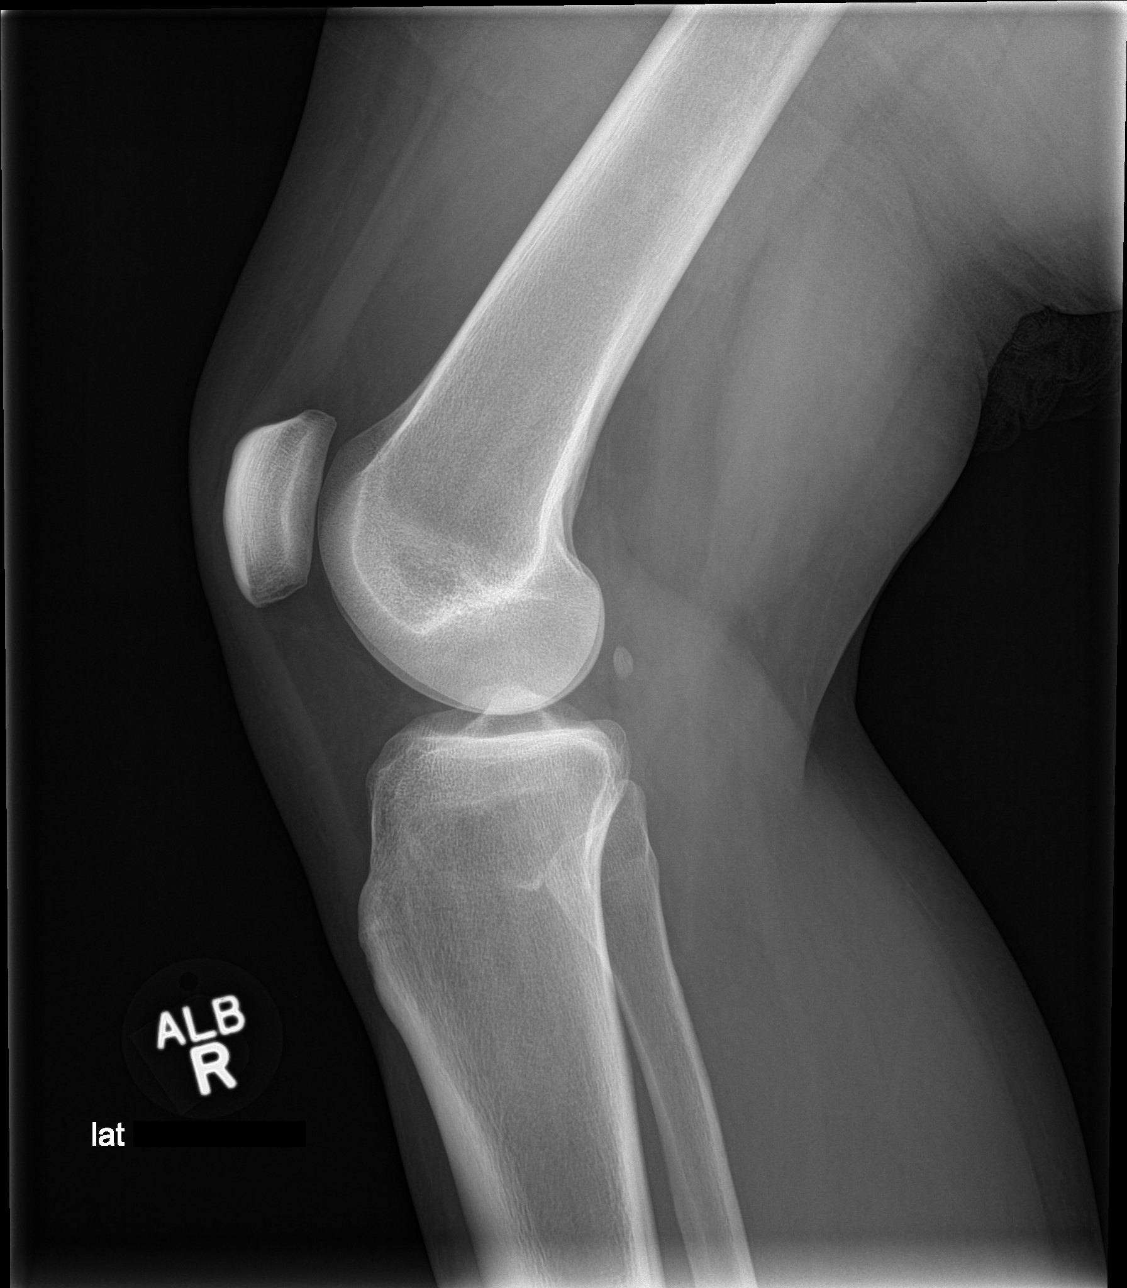

[tunnel]
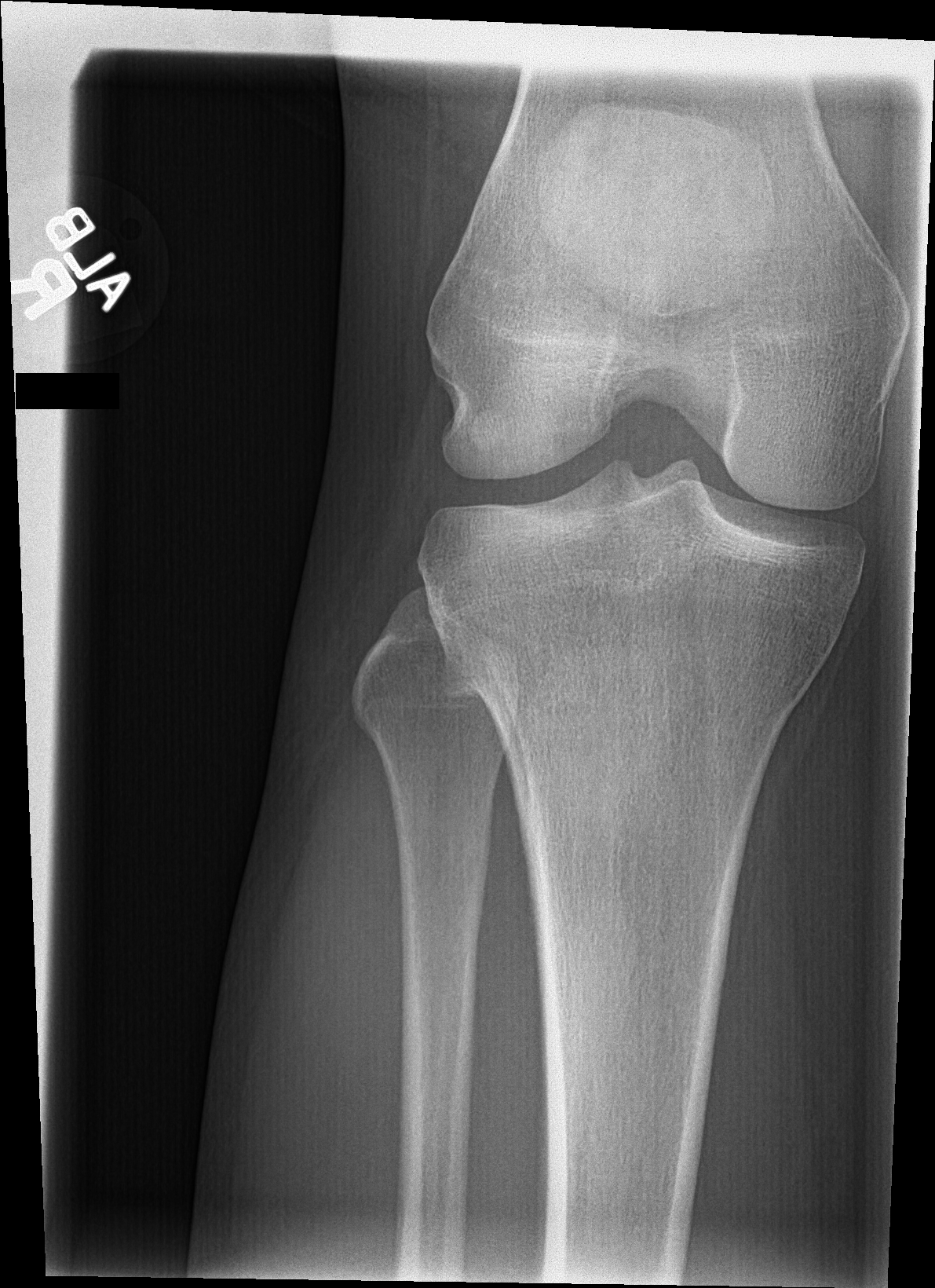

[patella skyline]
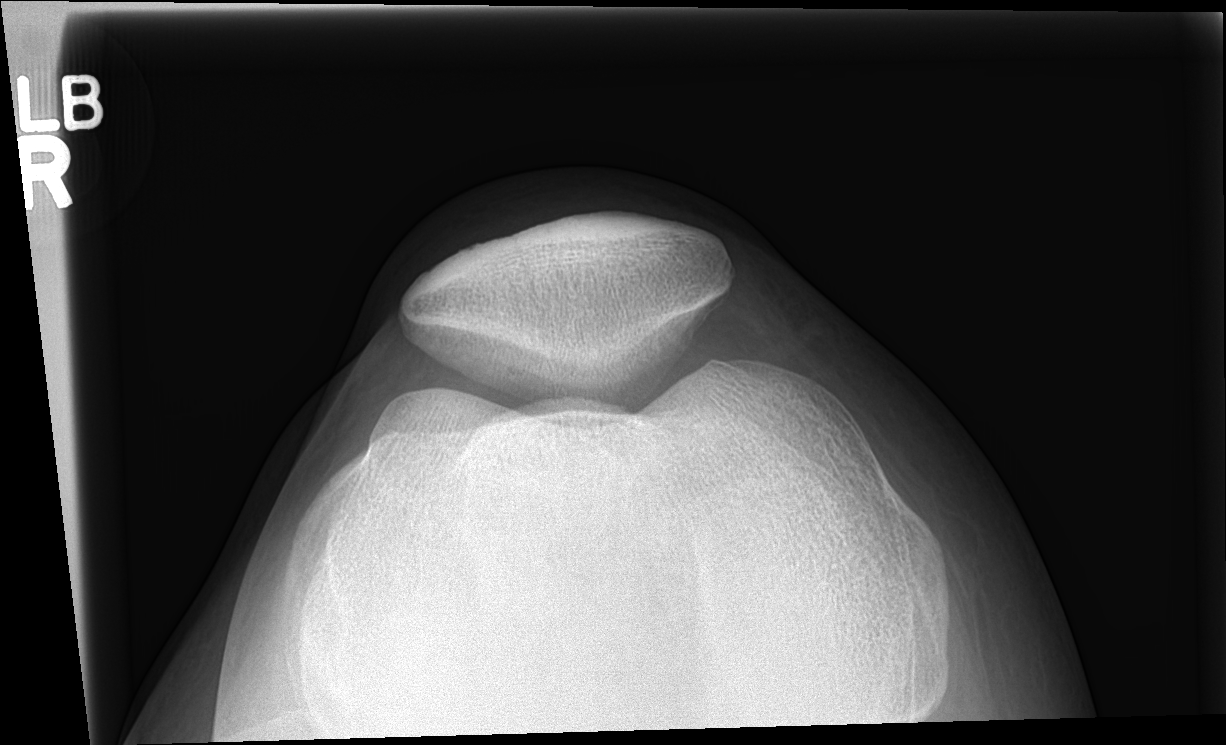

[4 of 4 positions shown; findings below may reference images not displayed]

FINDINGS: No evidence of fracture, dislocation, or joint effusion. No evidence
of arthropathy or other focal bone abnormality. Soft tissues are
unremarkable.
IMPRESSION: Negative.

## 2020-11-06 DIAGNOSIS — H6090 Unspecified otitis externa, unspecified ear: Secondary | ICD-10-CM | POA: Insufficient documentation

## 2020-11-06 DIAGNOSIS — R3911 Hesitancy of micturition: Secondary | ICD-10-CM | POA: Insufficient documentation

## 2021-02-12 ENCOUNTER — Ambulatory Visit
Admission: EM | Admit: 2021-02-12 | Discharge: 2021-02-12 | Disposition: A | Discharge status: Home / Self Care | Payer: Medicaid Other | Attending: Emergency Medicine | Admitting: Emergency Medicine

## 2021-02-12 ENCOUNTER — Encounter: Payer: Self-pay | Admitting: Emergency Medicine

## 2021-02-12 ENCOUNTER — Other Ambulatory Visit: Payer: Self-pay

## 2021-02-12 DIAGNOSIS — H6993 Unspecified Eustachian tube disorder, bilateral: Secondary | ICD-10-CM

## 2021-02-12 DIAGNOSIS — M26622 Arthralgia of left temporomandibular joint: Secondary | ICD-10-CM | POA: Diagnosis not present

## 2021-02-12 DIAGNOSIS — H6983 Other specified disorders of Eustachian tube, bilateral: Secondary | ICD-10-CM

## 2021-02-12 DIAGNOSIS — H9203 Otalgia, bilateral: Secondary | ICD-10-CM | POA: Diagnosis not present

## 2021-02-12 MED ORDER — PREDNISONE 20 MG PO TABS: 40.0000 mg | ORAL_TABLET | Freq: Every day | ORAL | 0 refills | Status: AC

## 2021-02-12 MED ORDER — CYCLOBENZAPRINE HCL 10 MG PO TABS: 10.0000 mg | ORAL_TABLET | Freq: Every day | ORAL | 0 refills | Status: DC

## 2021-02-12 MED ORDER — IBUPROFEN 600 MG PO TABS: 600.0000 mg | ORAL_TABLET | Freq: Four times a day (QID) | ORAL | 0 refills | Status: DC | PRN

## 2021-02-12 MED ORDER — FLUTICASONE PROPIONATE 50 MCG/ACT NA SUSP: 2.0000 | Freq: Every day | NASAL | 0 refills | Status: DC

## 2021-02-12 NOTE — ED Triage Notes (Signed)
Pt is present today with ear pain.Pt states that she was treated for an ear infection two months ago but is still having some pain.

## 2021-02-12 NOTE — ED Provider Notes (Signed)
HPI  SUBJECTIVE:  Jasmin Robinson is a 35 y.o. female who presents with 3 to 4 months of  Left ear pain.  Right ear started bothering her last night .she states that she feels as if she has "something in there".  She reports pain with yawning, blowing her nose.  The left ear pain got worse 3 weeks ago.  She reports decreased hearing in the left ear, occasional vertigo, popping with yawning.  States that she "hears an ocean in my ear".  She states that she has felt feverish, but does not have a thermometer at home.  No change in her nasal congestion or rhinorrhea.  She had identical symptoms 2 months ago and was treated for an otitis externa with unknown drops for 7 days which she states did not help.  No alleviating factors, she has not tried anything else for this.  She states that symptoms are worse when she gets her ear wet, with using a Q-tip.  No antibiotics in the past month.  Antipyretic in the past 6 hours.  LMP: She is spotting.  She denies possibility of being pregnant.  PMD: [facility] in [place].    Past Medical History:  Diagnosis Date   Anxiety    Migraines     Past Surgical History:  Procedure Laterality Date   CESAREAN SECTION      History reviewed. No pertinent family history.  Social History   Tobacco Use   Smoking status: Every Day    Types: Cigarettes   Smokeless tobacco: Never  Vaping Use   Vaping Use: Never used  Substance Use Topics   Alcohol use: Not Currently   Drug use: Never    No current facility-administered medications for this encounter.  Current Outpatient Medications:    cyclobenzaprine (FLEXERIL) 10 MG tablet, Take 1 tablet (10 mg total) by mouth at bedtime., Disp: 20 tablet, Rfl: 0   fluticasone (FLONASE) 50 MCG/ACT nasal spray, Place 2 sprays into both nostrils daily., Disp: 16 g, Rfl: 0   gabapentin (NEURONTIN) 600 MG tablet, TAKE 1 TABLET BY MOUTH FOUR TIMES DAILY WITH MEALS, Disp: , Rfl:    ibuprofen (ADVIL) 600 MG tablet,  Take 1 tablet (600 mg total) by mouth every 6 (six) hours as needed., Disp: 30 tablet, Rfl: 0   predniSONE (DELTASONE) 20 MG tablet, Take 2 tablets (40 mg total) by mouth daily with breakfast for 5 days., Disp: 10 tablet, Rfl: 0   albuterol (PROVENTIL HFA;VENTOLIN HFA) 108 (90 Base) MCG/ACT inhaler, Inhale into the lungs every 6 (six) hours as needed for wheezing or shortness of breath., Disp: , Rfl:   No Known Allergies   ROS  As noted in HPI.   Physical Exam  BP (!) 143/105   Pulse 72   Temp 98 F (36.7 C) (Oral)   Resp 18   SpO2 98%   Constitutional: Well developed, well nourished, no acute distress Eyes:  EOMI, conjunctiva normal bilaterally HENT: Normocephalic, atraumatic,mucus membranes moist.  Bilateral external ears normal.  No pain with traction on pinna, palpation of mastoid bilaterally.  Pain with palpation of tragus left side.  Left EAC erythematous, but not inflamed.  Positive debris blocking TM.  Decreased hearing left ear compared to right.  Right EAC, TM normal.  Positive tenderness left TMJ.  Pain with opening mouth fully.  No crepitus of the jaw. Respiratory: Normal inspiratory effort Cardiovascular: Normal rate GI: nondistended skin: No rash, skin intact Musculoskeletal: no deformities Neurologic: Alert & oriented  x 3, no focal neuro deficits Psychiatric: Speech and behavior appropriate   ED Course   Medications - No data to display  Orders Placed This Encounter  Procedures   Ear wax removal    L ear only    Standing Status:   Standing    Number of Occurrences:   1    No results found for this or any previous visit (from the past 24 hour(s)). No results found.  ED Clinical Impression  1. Otalgia of both ears   2. Arthralgia of left temporomandibular joint   3. Dysfunction of both eustachian tubes      ED Assessment/Plan  Suspect TMJ arthralgia plus or minus eustachian tube dysfunction.  Does not appear to be an otitis externa.  Will have  left ear irrigated out to ensure absence of otitis media.  Plan sent home with Flexeril, Tylenol/ibuprofen, prednisone 40 mg for 5 days, Flonase, soft diet, follow-up with dentist or ENT.  Dr. Andee Poles, ENT on-call.  Had left ear irrigated.  On reevaluation, was able to visualize TM.  TM normal, intact.  Discussed  MDM, treatment plan, and plan for follow-up with patient. patient agrees with plan.   Meds ordered this encounter  Medications   cyclobenzaprine (FLEXERIL) 10 MG tablet    Sig: Take 1 tablet (10 mg total) by mouth at bedtime.    Dispense:  20 tablet    Refill:  0   ibuprofen (ADVIL) 600 MG tablet    Sig: Take 1 tablet (600 mg total) by mouth every 6 (six) hours as needed.    Dispense:  30 tablet    Refill:  0   predniSONE (DELTASONE) 20 MG tablet    Sig: Take 2 tablets (40 mg total) by mouth daily with breakfast for 5 days.    Dispense:  10 tablet    Refill:  0   fluticasone (FLONASE) 50 MCG/ACT nasal spray    Sig: Place 2 sprays into both nostrils daily.    Dispense:  16 g    Refill:  0      *This clinic note was created using Scientist, clinical (histocompatibility and immunogenetics). Therefore, there may be occasional mistakes despite careful proofreading.  ?    Domenick Gong, MD 02/13/21 [phone]

## 2021-02-12 NOTE — Discharge Instructions (Addendum)
I believe you have a combination of temporomandibular joint pain and eustachian tube dysfunction.  Soft diet, 600 mg of ibuprofen combined with 1000 mg of Tylenol together 3-4 times a day as needed for pain.  Flexeril at night to help prevent you grinding your teeth, Flonase which will help with the eustachian tubes.  Prednisone will also help with pain and swelling.  Follow-up with your dentist.  You may need another bite guard.  Follow-up with Dr. Andee Poles

## 2021-06-22 ENCOUNTER — Ambulatory Visit
Admission: EM | Admit: 2021-06-22 | Discharge: 2021-06-22 | Disposition: A | Discharge status: Home / Self Care | Payer: Medicaid Other | Attending: Emergency Medicine | Admitting: Emergency Medicine

## 2021-06-22 ENCOUNTER — Other Ambulatory Visit: Payer: Self-pay

## 2021-06-22 ENCOUNTER — Encounter: Payer: Self-pay | Admitting: Emergency Medicine

## 2021-06-22 DIAGNOSIS — J0100 Acute maxillary sinusitis, unspecified: Secondary | ICD-10-CM

## 2021-06-22 DIAGNOSIS — F419 Anxiety disorder, unspecified: Secondary | ICD-10-CM | POA: Diagnosis not present

## 2021-06-22 MED ORDER — AMOXICILLIN-POT CLAVULANATE 875-125 MG PO TABS: 1.0000 | ORAL_TABLET | Freq: Two times a day (BID) | ORAL | 0 refills | Status: AC

## 2021-06-22 MED ORDER — BUSPIRONE HCL 7.5 MG PO TABS: 7.5000 mg | ORAL_TABLET | Freq: Two times a day (BID) | ORAL | 1 refills | Status: AC

## 2021-06-22 NOTE — ED Triage Notes (Addendum)
Pt presents today with c/o of anxiety and shortness of breath. She reports history of anxiety that has been exacerbated by stress. She also reports being recently diagnosed with rectal prolapse.

## 2021-06-22 NOTE — Discharge Instructions (Addendum)
Start taking the Buspar twice daily for management of your anxiety symptoms.  [facility] in [place] and ask for a telehealth appointment with your PCP and also with a therapist and psychiatrist.   Call [facility] for help with your transportation issues at [phone].  The Augmentin twice daily with food for 10 days for treatment of your sinusitis.  Perform sinus irrigation 2-3 times a day with a [employer] sinus rinse kit and distilled water.  Do not use tap water.  You can use plain over-the-counter Mucinex every 6 hours to break up the stickiness of the mucus so your body can clear it.  Increase your oral fluid intake to thin out your mucus so that is also able for your body to clear more easily.  Take an over-the-counter probiotic, such as Culturelle-align-activia, 1 hour after each dose of antibiotic to prevent diarrhea.  If you develop any new or worsening symptoms return for reevaluation or see your primary care provider.

## 2021-06-22 NOTE — ED Provider Notes (Signed)
[facility]    CSN: [id number] Arrival date & time: 06/22/21  1033      History   Chief Complaint Chief Complaint  Patient presents with   Anxiety    HPI Jasmin Robinson is a 35 y.o. female.   HPI  35 year old female here for evaluation of multiple complaints.  Patient's primary complaint is that she has been suffering from anxiety, and has suffered from anxiety for years, that is increasing recently due to increased stress load.  She had arranged for a medical flight for her mother back from [place] because she had a stroke.  She states that she will have episodes of shortness of breath that are associated with heart racing and numbness and tingling in her hands and around her mouth.  She states that she has been on medication in the past but it did not work and she stopped it herself.  She cannot member what she had taken in the past.  She recently moved from [facility] to [facility] and does not have a primary care provider.  She has been evaluated at [facility] in [place] but the [place] office does not have any available provider openings.  She is also having issues with transportation.  She reports that she has tried therapy in the past without any success and she is not in therapy at present.  Her secondary complaint is nasal congestion with a yellow-green nasal discharge that is been present for the past several weeks.  She denies any fever or cough.  Past Medical History:  Diagnosis Date   Anxiety    Migraines     There are no problems to display for this patient.   Past Surgical History:  Procedure Laterality Date   CESAREAN SECTION      OB History   No obstetric history on file.      Home Medications    Prior to Admission medications   Medication Sig Start Date End Date Taking? Authorizing Provider  amoxicillin-clavulanate (AUGMENTIN) 875-125 MG tablet Take 1 tablet by mouth every 12 (twelve) hours for 10 days. 06/22/21 07/02/21  Yes Margarette Canada, NP  busPIRone (BUSPAR) 7.5 MG tablet Take 1 tablet (7.5 mg total) by mouth 2 (two) times daily. 06/22/21  Yes Margarette Canada, NP  albuterol (PROVENTIL HFA;VENTOLIN HFA) 108 (90 Base) MCG/ACT inhaler Inhale into the lungs every 6 (six) hours as needed for wheezing or shortness of breath.    [provider]  cyclobenzaprine (FLEXERIL) 10 MG tablet Take 1 tablet (10 mg total) by mouth at bedtime. 02/12/21   Melynda Ripple, MD  fluticasone (FLONASE) 50 MCG/ACT nasal spray Place 2 sprays into both nostrils daily. 02/12/21   Melynda Ripple, MD  gabapentin (NEURONTIN) 600 MG tablet TAKE 1 TABLET BY MOUTH FOUR TIMES DAILY WITH MEALS 12/03/19   [provider]  ibuprofen (ADVIL) 600 MG tablet Take 1 tablet (600 mg total) by mouth every 6 (six) hours as needed. 02/12/21   Melynda Ripple, MD  citalopram (CELEXA) 10 MG tablet Take 10 mg by mouth daily.  01/20/19  [provider]  topiramate (TOPAMAX) 25 MG capsule Take 25 mg by mouth 2 (two) times daily.  01/20/19  [provider]    Family History History reviewed. No pertinent family history.  Social History Social History   Tobacco Use   Smoking status: Every Day    Types: Cigarettes   Smokeless tobacco: Never  Vaping Use   Vaping Use: Never used  Substance  Use Topics   Alcohol use: Not Currently   Drug use: Never     Allergies   Patient has no known allergies.   Review of Systems Review of Systems  Constitutional:  Negative for activity change, appetite change and fever.  HENT:  Positive for congestion, rhinorrhea and sinus pressure.   Respiratory:  Positive for shortness of breath. Negative for cough.   Psychiatric/Behavioral:  The patient is nervous/anxious.     Physical Exam Triage Vital Signs ED Triage Vitals  Enc Vitals Group     BP 06/22/21 1126 (!) 143/96     Pulse Rate 06/22/21 1126 74     Resp 06/22/21 1126 (!) 22     Temp 06/22/21 1126 98 F (36.7 C)     Temp  Source 06/22/21 1126 Oral     SpO2 06/22/21 1126 100 %     Weight --      Height --      Head Circumference --      Peak Flow --      Pain Score 06/22/21 1124 0     Pain Loc --      Pain Edu? --      Excl. in [place]? --    No data found.  Updated Vital Signs BP (!) 143/96 (BP Location: Left Arm)   Pulse 74   Temp 98 F (36.7 C) (Oral)   Resp (!) 22   LMP 06/22/2021 (Exact Date)   SpO2 100%   Visual Acuity Right Eye Distance:   Left Eye Distance:   Bilateral Distance:    Right Eye Near:   Left Eye Near:    Bilateral Near:     Physical Exam Vitals and nursing note reviewed.  Constitutional:      General: She is not in acute distress.    Appearance: Normal appearance. She is normal weight. She is not ill-appearing.  HENT:     Head: Normocephalic and atraumatic.     Right Ear: Tympanic membrane, ear canal and external ear normal. There is no impacted cerumen.     Left Ear: Tympanic membrane, ear canal and external ear normal. There is no impacted cerumen.     Nose: Congestion and rhinorrhea present.     Comments: Purulent discharge in both nares.  Maxillary sinuses are tender to percussion bilaterally.    Mouth/Throat:     Mouth: Mucous membranes are moist.     Pharynx: Oropharynx is clear. No posterior oropharyngeal erythema.  Cardiovascular:     Rate and Rhythm: Normal rate and regular rhythm.     Pulses: Normal pulses.     Heart sounds: Normal heart sounds. No murmur heard.   No gallop.  Pulmonary:     Effort: Pulmonary effort is normal.     Breath sounds: Normal breath sounds. No wheezing, rhonchi or rales.  Musculoskeletal:     Cervical back: Normal range of motion and neck supple.  Lymphadenopathy:     Cervical: No cervical adenopathy.  Skin:    General: Skin is warm and dry.     Capillary Refill: Capillary refill takes less than 2 seconds.     Findings: No erythema or rash.  Neurological:     General: No focal deficit present.     Mental Status: She is  alert and oriented to person, place, and time.  Psychiatric:        Thought Content: Thought content normal.        Judgment: Judgment normal.  Comments: Patient is anxious in appearance and has trouble sitting still.  She has normal train of thought and history recall.     UC Treatments / Results  Labs (all labs ordered are listed, but only abnormal results are displayed) Labs Reviewed - No data to display  EKG   Radiology No results found.  Procedures Procedures (including critical care time)  Medications Ordered in UC Medications - No data to display  Initial Impression / Assessment and Plan / UC Course  I have reviewed the triage vital signs and the nursing notes.  Pertinent labs & imaging results that were available during my care of the patient were reviewed by me and considered in my medical decision making (see chart for details).  Anxious 35 year old female here for evaluation of anxiety/panic attack symptoms as well as sinus symptoms.  Both have been ongoing for a significant duration of time.  The sinus symptoms have been going on for "weeks" and the anxiety has been longstanding with trials of different medication and therapy in the past without success.  Patient is not currently taking any medications or seeing any therapist.  She does not have a local primary care provider.  On physical exam patient has pearly-gray tympanic membranes bilaterally with normal light reflex and clear external auditory canals.  Nasal mucosa is erythematous and edematous with purulent discharge in both nares.  Oropharyngeal exam is benign.  Cardiopulmonary exam reveals clear lung sounds in all fields.  Does have tenderness to percussion of bilateral maxillary sinuses.  We will treat patient for maxillary sinusitis with Augmentin twice daily for 10 days.  Given patient's history of anxiety I have instructed her to follow-up with her PCP at [facility] in [place].  She should call the  office and ask for telehealth appointment to be evaluated by her PCP and also evaluated by psychiatry and psychotherapy, both of which are present at the [facility] clinic.  I will start her on BuSpar 7.5 mg twice daily for her anxiety symptoms in the interim.  Patient has transportation issues and I gave her the phone number to [facility].   Final Clinical Impressions(s) / UC Diagnoses   Final diagnoses:  Anxiety  Acute non-recurrent maxillary sinusitis     Discharge Instructions      Start taking the Buspar twice daily for management of your anxiety symptoms.  [facility] in [place] and ask for a telehealth appointment with your PCP and also with a therapist and psychiatrist.   Call [facility] for help with your transportation issues at [phone].  The Augmentin twice daily with food for 10 days for treatment of your sinusitis.  Perform sinus irrigation 2-3 times a day with a [employer] sinus rinse kit and distilled water.  Do not use tap water.  You can use plain over-the-counter Mucinex every 6 hours to break up the stickiness of the mucus so your body can clear it.  Increase your oral fluid intake to thin out your mucus so that is also able for your body to clear more easily.  Take an over-the-counter probiotic, such as Culturelle-align-activia, 1 hour after each dose of antibiotic to prevent diarrhea.  If you develop any new or worsening symptoms return for reevaluation or see your primary care provider.      ED Prescriptions     Medication Sig Dispense Auth. Provider   busPIRone (BUSPAR) 7.5 MG tablet Take 1 tablet (7.5 mg total) by mouth 2 (two) times  daily. 60 tablet Margarette Canada, NP   amoxicillin-clavulanate (AUGMENTIN) 875-125 MG tablet Take 1 tablet by mouth every 12 (twelve) hours for 10 days. 20 tablet Margarette Canada, NP      PDMP not reviewed this encounter.   Margarette Canada, NP 06/22/21  1302

## 2021-07-13 ENCOUNTER — Other Ambulatory Visit: Payer: Self-pay

## 2021-07-13 ENCOUNTER — Ambulatory Visit
Admission: EM | Admit: 2021-07-13 | Discharge: 2021-07-13 | Disposition: A | Discharge status: Home / Self Care | Payer: Medicaid Other | Attending: Physician Assistant | Admitting: Physician Assistant

## 2021-07-13 DIAGNOSIS — M79642 Pain in left hand: Secondary | ICD-10-CM | POA: Diagnosis not present

## 2021-07-13 DIAGNOSIS — W5501XA Bitten by cat, initial encounter: Secondary | ICD-10-CM | POA: Diagnosis not present

## 2021-07-13 MED ORDER — FLUCONAZOLE 150 MG PO TABS: 150.0000 mg | ORAL_TABLET | Freq: Once | ORAL | 1 refills | Status: AC

## 2021-07-13 MED ORDER — ACETAMINOPHEN 325 MG PO TABS
650.0000 mg | ORAL_TABLET | Freq: Once | ORAL | Status: AC
  Administered 2021-07-13: 650 mg via ORAL

## 2021-07-13 MED ORDER — PENICILLIN V POTASSIUM 500 MG PO TABS: 500.0000 mg | ORAL_TABLET | Freq: Three times a day (TID) | ORAL | 0 refills | Status: AC

## 2021-07-13 MED ORDER — DOXYCYCLINE HYCLATE 100 MG PO CAPS: 100.0000 mg | ORAL_CAPSULE | Freq: Two times a day (BID) | ORAL | 0 refills | Status: AC

## 2021-07-13 MED ORDER — HYDROCODONE-ACETAMINOPHEN 5-325 MG PO TABS: 1.0000 | ORAL_TABLET | Freq: Three times a day (TID) | ORAL | 0 refills | Status: AC | PRN

## 2021-07-13 NOTE — ED Triage Notes (Signed)
Pt c/o cat bite to left hand 2 days ago. Pt was trying to separate a foster cat and her cat from fighting inside her home and the foster cat bit her hand under her thumb and scratched her down her forearm. Pt states that she can not move her left hand nor bend her fingers. Pt states that she has swelling along her hand and wrist. Pt states that the cat was originally in [place] but the bite happened in [place]. Pt has taken OTC ibuprofen but states that nothing helps with the pain.

## 2021-07-13 NOTE — ED Provider Notes (Signed)
[facility]    CSN: [id number] Arrival date & time: 07/13/21  1607      History   Chief Complaint Chief Complaint  Patient presents with   Animal Bite    HPI Jasmin Robinson is a 36 y.o. female presenting for cat bite to the left hand and wrist.  Patient says she was Sitting a foster cat and this cat and her cat got into a fight.  She was trying to separate them.  She says the cat bit her hand into her thumb and scratched her forearm.  She reports it did not hurt at first but over the past 24 hours she has had significant pain in this wrist and hand and says she cannot move it.  Has tried ibuprofen and Tylenol without any improvement in symptoms.  Reports increased swelling especially at the base of the thumb.  Patient up-to-date on tetanus.  She is unsure of the cats vaccinations.  Patient was advised that we we will report this to animal control.  Patient refuses any rabies prevention.  Advised her that we cannot start rabies vaccinations and she would need to go the ER.  She declines and says she knows cat does not rabies.  Patient aware of risks of not getting started on rabies without knowing the cats vaccination status.  HPI  Past Medical History:  Diagnosis Date   Anxiety    Migraines     There are no problems to display for this patient.   Past Surgical History:  Procedure Laterality Date   CESAREAN SECTION      OB History   No obstetric history on file.      Home Medications    Prior to Admission medications   Medication Sig Start Date End Date Taking? Authorizing Provider  busPIRone (BUSPAR) 7.5 MG tablet Take 1 tablet (7.5 mg total) by mouth 2 (two) times daily. 06/22/21  Yes Margarette Canada, NP  doxycycline (VIBRAMYCIN) 100 MG capsule Take 1 capsule (100 mg total) by mouth 2 (two) times daily for 7 days. 07/13/21 07/20/21 Yes Danton Clap, PA-C  fluconazole (DIFLUCAN) 150 MG tablet Take 1 tablet (150 mg total) by mouth once for 1 day. 07/13/21 07/14/21 Yes  Danton Clap, PA-C  HYDROcodone-acetaminophen (NORCO/VICODIN) 5-325 MG tablet Take 1 tablet by mouth every 8 (eight) hours as needed for up to 2 days for severe pain. 07/13/21 07/15/21 Yes Laurene Footman B, PA-C  penicillin v potassium (VEETID) 500 MG tablet Take 1 tablet (500 mg total) by mouth 3 (three) times daily for 7 days. 07/13/21 07/20/21 Yes Laurene Footman B, PA-C  albuterol (PROVENTIL HFA;VENTOLIN HFA) 108 (90 Base) MCG/ACT inhaler Inhale into the lungs every 6 (six) hours as needed for wheezing or shortness of breath.    [provider]  cyclobenzaprine (FLEXERIL) 10 MG tablet Take 1 tablet (10 mg total) by mouth at bedtime. 02/12/21   Melynda Ripple, MD  fluticasone (FLONASE) 50 MCG/ACT nasal spray Place 2 sprays into both nostrils daily. 02/12/21   Melynda Ripple, MD  gabapentin (NEURONTIN) 600 MG tablet TAKE 1 TABLET BY MOUTH FOUR TIMES DAILY WITH MEALS 12/03/19   [provider]  ibuprofen (ADVIL) 600 MG tablet Take 1 tablet (600 mg total) by mouth every 6 (six) hours as needed. 02/12/21   Melynda Ripple, MD  citalopram (CELEXA) 10 MG tablet Take 10 mg by mouth daily.  01/20/19  [provider]  topiramate (TOPAMAX) 25 MG capsule Take 25 mg by mouth  2 (two) times daily.  01/20/19  [provider]    Family History History reviewed. No pertinent family history.  Social History Social History   Tobacco Use   Smoking status: Every Day    Types: Cigarettes   Smokeless tobacco: Never  Vaping Use   Vaping Use: Some days  Substance Use Topics   Alcohol use: Yes   Drug use: Never     Allergies   Patient has no known allergies.   Review of Systems Review of Systems  Constitutional:  Negative for fatigue and fever.  Musculoskeletal:  Positive for joint swelling. Negative for arthralgias.  Skin:  Positive for color change and wound.  Neurological:  Negative for weakness and numbness.  Hematological:  Negative for adenopathy.    Physical  Exam Triage Vital Signs ED Triage Vitals  Enc Vitals Group     BP 07/13/21 1836 122/81     Pulse Rate 07/13/21 1836 80     Resp 07/13/21 1836 18     Temp 07/13/21 1836 98.8 F (37.1 C)     Temp Source 07/13/21 1836 Oral     SpO2 07/13/21 1836 98 %     Weight 07/13/21 1833 175 lb (79.4 kg)     Height 07/13/21 1833 5\' 7"  (1.702 m)     Head Circumference --      Peak Flow --      Pain Score 07/13/21 1833 10     Pain Loc --      Pain Edu? --      Excl. in [place]? --    No data found.  Updated Vital Signs BP 122/81 (BP Location: Left Arm)    Pulse 80    Temp 98.8 F (37.1 C) (Oral)    Resp 18    Ht 5\' 7"  (1.702 m)    Wt 175 lb (79.4 kg)    LMP 06/22/2021 (Exact Date)    SpO2 98%    BMI 27.41 kg/m      Physical Exam Vitals and nursing note reviewed.  Constitutional:      General: She is not in acute distress.    Appearance: Normal appearance. She is not ill-appearing or toxic-appearing.  HENT:     Head: Normocephalic and atraumatic.  Eyes:     General: No scleral icterus.       Right eye: No discharge.        Left eye: No discharge.     Conjunctiva/sclera: Conjunctivae normal.  Cardiovascular:     Rate and Rhythm: Normal rate and regular rhythm.     Pulses: Normal pulses.  Pulmonary:     Effort: Pulmonary effort is normal. No respiratory distress.  Musculoskeletal:     Cervical back: Neck supple.  Skin:    General: Skin is dry.     Comments: LEFT HAND/WRIST: See images below.  Patient has puncture wounds of left thumb, dorsal hand, and wrist.  Multiple abrasions consistent with cat scratches of dorsal forearm.  Significant swelling of thumb and hypothenar region as well as radial side of wrist and dorsal hand.  Significant tenderness palpation throughout these areas.  There is surrounding erythema of the punctures.  Faint red streak of ventral wrist.  Neurological:     General: No focal deficit present.     Mental Status: She is alert. Mental status is at baseline.      Motor: No weakness.     Gait: Gait normal.  Psychiatric:  Mood and Affect: Mood normal.        Behavior: Behavior normal.        Thought Content: Thought content normal.        UC Treatments / Results  Labs (all labs ordered are listed, but only abnormal results are displayed) Labs Reviewed - No data to display  EKG   Radiology No results found.  Procedures Procedures (including critical care time)  Medications Ordered in UC Medications  acetaminophen (TYLENOL) tablet 650 mg (650 mg Oral Given 07/13/21 1845)    Initial Impression / Assessment and Plan / UC Course  I have reviewed the triage vital signs and the nursing notes.  Pertinent labs & imaging results that were available during my care of the patient were reviewed by me and considered in my medical decision making (see chart for details).  36 year old female presenting for cat bite injury as well as cat scratch injury from 2 days ago.  Patient reports significant worsening pain over the past 24 hours.  Images included in chart.  Patient does have areas of puncture consistent with cat bite and abrasion consistent with cat scratches.  She has not had any fevers or swollen lymph nodes.  Reports significant pain and swelling of her hand.  Treating patient for infected cat bite wound.  Covering her with penicillin VK and doxycycline.  Patient also requests Diflucan since she will be on antibiotics and would like to prevent yeast infection.  Sent this to pharmacy.  Reviewed wound care and icing the wrist as well as elevating it.  Sent a few Norco for severe pain but advised her after 2 days she should not have much pain with the antibiotics treat the infection.  Patient advised to go to emergency department if she develops fever, the red streak moves further up her arm, she has increased pain or swelling or swollen lymph nodes.  Patient agrees.  This was reported to animal control.   Final Clinical Impressions(s) / UC  Diagnoses   Final diagnoses:  Infected cat bite, initial encounter  Left hand pain     Discharge Instructions      -You have an infected cat bite.  I have sent 2 different antibiotics to treat the infection.  Have also sent something for pain. -If you develop a fever, increased pain, increased swelling or redness or you are not improving the next 2 days you need to be seen again.  Please go to the ER for severe acute worsening of symptoms.  Otherwise, please make an appointment to follow-up with your PCP this week.     ED Prescriptions     Medication Sig Dispense Auth. Provider   penicillin v potassium (VEETID) 500 MG tablet Take 1 tablet (500 mg total) by mouth 3 (three) times daily for 7 days. 21 tablet Laurene Footman B, PA-C   doxycycline (VIBRAMYCIN) 100 MG capsule Take 1 capsule (100 mg total) by mouth 2 (two) times daily for 7 days. 14 capsule Laurene Footman B, PA-C   fluconazole (DIFLUCAN) 150 MG tablet Take 1 tablet (150 mg total) by mouth once for 1 day. 1 tablet Danton Clap, PA-C   HYDROcodone-acetaminophen (NORCO/VICODIN) 5-325 MG tablet Take 1 tablet by mouth every 8 (eight) hours as needed for up to 2 days for severe pain. 6 tablet Danton Clap, PA-C      I have reviewed the PDMP during this encounter.   Danton Clap, PA-C 07/14/21 [phone]

## 2021-07-13 NOTE — Discharge Instructions (Addendum)
-  You have an infected cat bite.  I have sent 2 different antibiotics to treat the infection.  Have also sent something for pain. -If you develop a fever, increased pain, increased swelling or redness or you are not improving the next 2 days you need to be seen again.  Please go to the ER for severe acute worsening of symptoms.  Otherwise, please make an appointment to follow-up with your PCP this week.

## 2021-07-13 NOTE — ED Triage Notes (Signed)
PT is unsure of rabies vaccine status of cat that bit her. States that cat is an indoor cat, but was recently acquired from a foster situation. PT made aware that standards of care would include treatment for rabies prevention which cannot be done at this clinic. Patient states she is confident that the cat does not have rabies and that she would like to refuse any rabies prevention and just be seen for bite as it is painful. Provider made aware.

## 2021-07-14 ENCOUNTER — Telehealth: Payer: Self-pay | Admitting: Physician Assistant

## 2021-07-14 NOTE — Telephone Encounter (Signed)
Called [place] [employer] to report a cat bite that occurred on 07/11/21. Pt was seen in office on 07/13/20 and declined rabies treatment. Pt was made aware that Animal control would be contacted. Was infomed by [employer] that due to the address of the patient being in [place], I would have to call [place] PD. Was given the number [phone]. Called [place] [employer] and spoke to [place] in communications to report the bite. Informed Benny that Eulanda was seen in office on 07/13/20 for a cat bite to the left hand after fostering the cat from a friend. Pt tried to intervene as the fostered cat attacked her cat and that is when the bite occurred. Pt declined the rabies vaccine at the time and that the cat that caused the bite has been returned to its original owner. Benny informed me that an officer will reach out for more information.

## 2021-08-04 DIAGNOSIS — K623 Rectal prolapse: Secondary | ICD-10-CM | POA: Insufficient documentation

## 2021-10-21 DIAGNOSIS — J302 Other seasonal allergic rhinitis: Secondary | ICD-10-CM | POA: Insufficient documentation

## 2021-12-17 ENCOUNTER — Emergency Department
Admission: EM | Admit: 2021-12-17 | Discharge: 2021-12-17 | Discharge status: Against Medical Advice | Payer: Medicaid Other

## 2021-12-17 NOTE — ED Notes (Signed)
Pt was visualized going outside- pt not outside when called for triage

## 2022-01-18 ENCOUNTER — Emergency Department
Admission: EM | Admit: 2022-01-18 | Discharge: 2022-01-18 | Disposition: A | Discharge status: Home / Self Care | Payer: Medicaid Other | Attending: Emergency Medicine | Admitting: Emergency Medicine

## 2022-01-18 ENCOUNTER — Other Ambulatory Visit: Payer: Self-pay

## 2022-01-18 ENCOUNTER — Emergency Department: Payer: Medicaid Other

## 2022-01-18 DIAGNOSIS — R079 Chest pain, unspecified: Secondary | ICD-10-CM | POA: Diagnosis present

## 2022-01-18 DIAGNOSIS — R0602 Shortness of breath: Secondary | ICD-10-CM | POA: Diagnosis not present

## 2022-01-18 DIAGNOSIS — F419 Anxiety disorder, unspecified: Secondary | ICD-10-CM | POA: Diagnosis not present

## 2022-01-18 LAB — CBC WITH DIFFERENTIAL/PLATELET
Abs Immature Granulocytes: 0.02 10*3/uL (ref 0.00–0.07)
Basophils Absolute: 0.1 10*3/uL (ref 0.0–0.1)
Basophils Relative: 1 %
Eosinophils Absolute: 0.8 10*3/uL — ABNORMAL HIGH (ref 0.0–0.5)
Eosinophils Relative: 8 %
HCT: 33.1 % — ABNORMAL LOW (ref 36.0–46.0)
Hemoglobin: 10.4 g/dL — ABNORMAL LOW (ref 12.0–15.0)
Immature Granulocytes: 0 %
Lymphocytes Relative: 26 %
Lymphs Abs: 2.6 10*3/uL (ref 0.7–4.0)
MCH: 26.3 pg (ref 26.0–34.0)
MCHC: 31.4 g/dL (ref 30.0–36.0)
MCV: 83.6 fL (ref 80.0–100.0)
Monocytes Absolute: 0.8 10*3/uL (ref 0.1–1.0)
Monocytes Relative: 8 %
Neutro Abs: 5.7 10*3/uL (ref 1.7–7.7)
Neutrophils Relative %: 57 %
Platelets: 296 10*3/uL (ref 150–400)
RBC: 3.96 MIL/uL (ref 3.87–5.11)
RDW: 14.8 % (ref 11.5–15.5)
WBC: 10 10*3/uL (ref 4.0–10.5)
nRBC: 0 % (ref 0.0–0.2)

## 2022-01-18 LAB — COMPREHENSIVE METABOLIC PANEL
ALT: 12 U/L (ref 0–44)
AST: 18 U/L (ref 15–41)
Albumin: 3.7 g/dL (ref 3.5–5.0)
Alkaline Phosphatase: 54 U/L (ref 38–126)
Anion gap: 10 (ref 5–15)
BUN: 12 mg/dL (ref 6–20)
CO2: 20 mmol/L — ABNORMAL LOW (ref 22–32)
Calcium: 9.1 mg/dL (ref 8.9–10.3)
Chloride: 110 mmol/L (ref 98–111)
Creatinine, Ser: 0.68 mg/dL (ref 0.44–1.00)
GFR, Estimated: >60 mL/min (ref >60)
Glucose, Bld: 100 mg/dL — ABNORMAL HIGH (ref 70–99)
Potassium: 3.4 mmol/L — ABNORMAL LOW (ref 3.5–5.1)
Sodium: 140 mmol/L (ref 135–145)
Total Bilirubin: 0.5 mg/dL (ref 0.3–1.2)
Total Protein: 7.4 g/dL (ref 6.5–8.1)

## 2022-01-18 LAB — LIPASE, BLOOD: Lipase: 22 U/L (ref 11–51)

## 2022-01-18 LAB — TROPONIN I (HIGH SENSITIVITY): Troponin I (High Sensitivity): 4 ng/L (ref <18)

## 2022-01-18 MED ORDER — IOHEXOL 350 MG/ML SOLN
75.0000 mL | Freq: Once | INTRAVENOUS | Status: AC | PRN
Start: 2022-01-18 — End: 2022-01-18
  Administered 2022-01-18: 75 mL via INTRAVENOUS

## 2022-01-18 MED ORDER — ONDANSETRON HCL 4 MG/2ML IJ SOLN
4.0000 mg | Freq: Once | INTRAMUSCULAR | Status: AC
  Administered 2022-01-18: 4 mg via INTRAVENOUS
  Filled 2022-01-18: qty 2

## 2022-01-18 MED ORDER — MORPHINE SULFATE (PF) 4 MG/ML IV SOLN
4.0000 mg | Freq: Once | INTRAVENOUS | Status: AC
  Administered 2022-01-18: 4 mg via INTRAVENOUS
  Filled 2022-01-18: qty 1

## 2022-01-18 MED ORDER — HYDROXYZINE HCL 25 MG PO TABS: 25.0000 mg | ORAL_TABLET | Freq: Three times a day (TID) | ORAL | 0 refills | Status: AC | PRN

## 2022-01-18 MED ORDER — LORAZEPAM 2 MG/ML IJ SOLN
1.0000 mg | Freq: Once | INTRAMUSCULAR | Status: AC
  Administered 2022-01-18: 1 mg via INTRAVENOUS
  Filled 2022-01-18: qty 1

## 2022-01-18 NOTE — ED Notes (Signed)
Pt up for D/C at this time but is still very sleepy and falls back to sleep, MD aware and suggest to watch a little longer for safe D/C.

## 2022-01-18 NOTE — ED Provider Notes (Signed)
[facility] Provider Note    Event Date/Time   First MD Initiated Contact with Patient 01/18/22 0915     (approximate)  History   Chief Complaint: Abdominal Pain  HPI  Jasmin Robinson is a 36 y.o. female with a past medical history anxiety presents to the emergency department for right chest pain.  According to the patient for the past 2 days or so she has been experiencing sharp shooting pains to the right lower chest.  Patient states it is very painful to take a deep breath.  Denies any abdominal pain, denies any central chest pain.  Does states some shortness of breath but due to discomfort when taking a deep breath.  Patient does admit to severe anxiety as well.  Patient states a similar event occurred 1 year ago but they could not find out what caused the discomfort.  Patient denies any personal or family history of heart disease.  Physical Exam   Triage Vital Signs: ED Triage Vitals  Enc Vitals Group     BP 01/18/22 0842 105/90     Pulse Rate 01/18/22 0842 78     Resp 01/18/22 0842 17     Temp 01/18/22 0842 98.4 F (36.9 C)     Temp Source 01/18/22 0842 Oral     SpO2 01/18/22 0842 100 %     Weight 01/18/22 0843 170 lb (77.1 kg)     Height 01/18/22 0843 5\' 7"  (1.702 m)     Head Circumference --      Peak Flow --      Pain Score 01/18/22 0843 10     Pain Loc --      Pain Edu? --      Excl. in [place]? --     Most recent vital signs: Vitals:   01/18/22 0842 01/18/22 0900  BP: 105/90 125/82  Pulse: 78 77  Resp: 17 (!) 28  Temp: 98.4 F (36.9 C)   SpO2: 100% 100%    General: Awake, mild distress, patient holding her right chest is tearful at times. CV:  Good peripheral perfusion.  Regular rate and rhythm  Resp:  Normal effort.  Equal breath sounds bilaterally.  Abd:  No distention.  Soft, nontender.  No rebound or guarding. Other:  No leg tenderness or swelling.   ED Results / Procedures / Treatments   EKG  EKG viewed and interpreted by  myself shows a normal sinus rhythm at 69 bpm with a narrow QRS, normal axis, normal intervals, nonspecific but no concerning ST changes.  RADIOLOGY  I have viewed and interpreted the CTA images.  I do not see any obvious pulmonary embolism on my evaluation. Radiology is read the CT scan is negative for PE or other acute abnormality.   MEDICATIONS ORDERED IN ED: Medications  morphine (PF) 4 MG/ML injection 4 mg (has no administration in time range)  ondansetron (ZOFRAN) injection 4 mg (has no administration in time range)     IMPRESSION / MDM / ASSESSMENT AND PLAN / ED COURSE  I reviewed the triage vital signs and the nursing notes.  Patient's presentation is most consistent with acute presentation with potential threat to life or bodily function.  Patient presents to the emergency department for right-sided chest pain worse with inspiration described as sharp and significant in nature over the past 2 days.  Patient has no abdominal tenderness.  Does have pain with tenderness over the right lower lateral ribs.  Denies trauma.  Denies  any cough or fever.  No history of heart disease or PE previously.  Patient is on birth control for PCOS per patient.  States prior tubal ligation.  We will check labs including LFTs lipase as well as a troponin.  Given the patient's pleuritic nature we will obtain a CTA of the chest to rule out PE or other intrathoracic pathology such as pneumonia or pneumothorax.  We will treat pain and nausea and IV hydrate while awaiting results.  Patient agreeable to plan.  CTA of the chest is negative for any acute abnormality.  Patient's CBC, chemistry and troponin are normal lipase normal.  Patient's work-up is overall very reassuring.  Patient did become quite somnolent after Ativan however states she feels much better.  We will allow the patient to rest in the emergency department she is calling for a ride home.  FINAL CLINICAL IMPRESSION(S) / ED DIAGNOSES   Right  chest pain Anxiety  Note:  This document was prepared using Dragon voice recognition software and may include unintentional dictation errors.   Minna Antis, MD 01/18/22 1319

## 2022-01-18 NOTE — ED Notes (Signed)
Pt at CT

## 2022-01-18 NOTE — ED Notes (Signed)
Pt not able to sit still at this time for EKG at this time due to "pain".

## 2022-01-18 NOTE — ED Triage Notes (Addendum)
Pt presents to ED with c /o of R sided lateral ABD pain that started 24 hours ago. Pt denies fevers chills, pt reports N/V, deneis diarrhea.  Pt refuses to MSE due to "[place] not going to do anything anyways".

## 2022-01-18 NOTE — ED Notes (Signed)
Pt is alert to name and when pt was told she was up for D/C she stated she had no ride home. When pt was educated on the bus system pt refuses to use the bus and states "I don't know how to use a bus". Pt was also asked if there was anyone this RN could call for her to come pick her up, pt states "No, I don't have anyone why do you keep asking me that".

## 2022-01-18 NOTE — ED Notes (Signed)
Pt would not let this RN finish asking triage questions due to "it fucking hurts when I talk". Pt also was not comfortable with nursing students in room while PIV as being placed, NS asked to leave room to help with anxiety, pt is very aggressive and rude and mumbles things under her breath and will not give clear answers. Pt also states when asked name and DOB were being asked to properly place labels on blood specimens "you've already asked five times and it hurts to talk, dont ask again". Pt educated she will need to provide this information when asked and it is for safety reasons. Pt is able to speak in full sentences and appears be having an anxiety/panick attack, pt educated on breathing exercises.

## 2022-01-18 NOTE — ED Notes (Signed)
D/C, new RX, and reasons to return to ED discussed with pt. Pt was told she could not have a cab voucher to get home and pt was advised that this RN could possibly have security drop her off at the bus stop, pt states "Im not doing that Ill get my own ride, I'm getting out of here".

## 2022-01-18 NOTE — ED Notes (Signed)
Pt ambulatory with steady gait on D/C.

## 2022-01-18 NOTE — ED Notes (Signed)
Pt refusing vitals at this time and states "I am fine, I'm talking aren't I".

## 2022-01-18 NOTE — ED Notes (Signed)
Pt refuses to MSE due to "[place] not going to do anything anyways".

## 2022-03-11 DIAGNOSIS — F32A Depression, unspecified: Secondary | ICD-10-CM | POA: Insufficient documentation

## 2022-05-28 DIAGNOSIS — R197 Diarrhea, unspecified: Secondary | ICD-10-CM | POA: Insufficient documentation

## 2022-05-29 ENCOUNTER — Encounter: Payer: Self-pay | Admitting: Emergency Medicine

## 2022-05-29 ENCOUNTER — Ambulatory Visit
Admission: EM | Admit: 2022-05-29 | Discharge: 2022-05-29 | Disposition: A | Discharge status: Home / Self Care | Payer: Medicaid Other | Attending: Physician Assistant | Admitting: Physician Assistant

## 2022-05-29 DIAGNOSIS — J111 Influenza due to unidentified influenza virus with other respiratory manifestations: Secondary | ICD-10-CM

## 2022-05-29 DIAGNOSIS — R112 Nausea with vomiting, unspecified: Secondary | ICD-10-CM

## 2022-05-29 DIAGNOSIS — R0981 Nasal congestion: Secondary | ICD-10-CM

## 2022-05-29 DIAGNOSIS — S8001XA Contusion of right knee, initial encounter: Secondary | ICD-10-CM

## 2022-05-29 DIAGNOSIS — M25561 Pain in right knee: Secondary | ICD-10-CM

## 2022-05-29 DIAGNOSIS — G8929 Other chronic pain: Secondary | ICD-10-CM

## 2022-05-29 MED ORDER — ONDANSETRON 4 MG PO TBDP: 4.0000 mg | ORAL_TABLET | Freq: Three times a day (TID) | ORAL | 0 refills | Status: DC | PRN

## 2022-05-29 MED ORDER — NAPROXEN 500 MG PO TABS: 500.0000 mg | ORAL_TABLET | Freq: Two times a day (BID) | ORAL | 0 refills | Status: DC

## 2022-05-29 MED ORDER — OSELTAMIVIR PHOSPHATE 75 MG PO CAPS: 75.0000 mg | ORAL_CAPSULE | Freq: Two times a day (BID) | ORAL | 0 refills | Status: AC

## 2022-05-29 NOTE — ED Provider Notes (Signed)
[facility]    CSN: [id number] Arrival date & time: 05/29/22  1502      History   Chief Complaint Chief Complaint  Patient presents with   Knee Pain    HPI [name] is a 36 y.o. female presenting for multiple complaints.  She reports pain and swelling of the right knee.  States that she banged on her car yesterday.  She says she already has a problem with her knees and is supposed to start physical therapy tomorrow.  It is hurting her worse now.  She has not taken anything for pain relief but request something for pain today.  Patient is also here with her daughter who has a fever and URI symptoms.  Patient reports nasal congestion, nausea/vomiting and diarrhea for the past 2 days.  She is not reporting any cough, sore throat, chest pain, breathing difficulty or abdominal pain.  Has not been taking any medication for her symptoms.  HPI  Past Medical History:  Diagnosis Date   Anxiety    Migraines     There are no problems to display for this patient.   Past Surgical History:  Procedure Laterality Date   CESAREAN SECTION      OB History   No obstetric history on file.      Home Medications    Prior to Admission medications   Medication Sig Start Date End Date Taking? Authorizing Provider  naproxen (NAPROSYN) 500 MG tablet Take 1 tablet (500 mg total) by mouth 2 (two) times daily with a meal. 05/29/22  Yes Laurene Footman B, PA-C  ondansetron (ZOFRAN-ODT) 4 MG disintegrating tablet Take 1 tablet (4 mg total) by mouth every 8 (eight) hours as needed for nausea or vomiting. 05/29/22  Yes Danton Clap, PA-C  oseltamivir (TAMIFLU) 75 MG capsule Take 1 capsule (75 mg total) by mouth every 12 (twelve) hours for 5 days. 05/29/22 06/03/22 Yes Laurene Footman B, PA-C  albuterol (PROVENTIL HFA;VENTOLIN HFA) 108 (90 Base) MCG/ACT inhaler Inhale into the lungs every 6 (six) hours as needed for wheezing or shortness of breath.    [provider]   busPIRone (BUSPAR) 7.5 MG tablet Take 1 tablet (7.5 mg total) by mouth 2 (two) times daily. 06/22/21   Margarette Canada, NP  cyclobenzaprine (FLEXERIL) 10 MG tablet Take 1 tablet (10 mg total) by mouth at bedtime. 02/12/21   Melynda Ripple, MD  fluticasone (FLONASE) 50 MCG/ACT nasal spray Place 2 sprays into both nostrils daily. 02/12/21   Melynda Ripple, MD  gabapentin (NEURONTIN) 600 MG tablet TAKE 1 TABLET BY MOUTH FOUR TIMES DAILY WITH MEALS 12/03/19   [provider]  hydrOXYzine (ATARAX) 25 MG tablet Take 1 tablet (25 mg total) by mouth 3 (three) times daily as needed for anxiety. 01/18/22   Harvest Dark, MD  citalopram (CELEXA) 10 MG tablet Take 10 mg by mouth daily.  01/20/19  [provider]  topiramate (TOPAMAX) 25 MG capsule Take 25 mg by mouth 2 (two) times daily.  01/20/19  [provider]    Family History History reviewed. No pertinent family history.  Social History Social History   Tobacco Use   Smoking status: Every Day    Types: Cigarettes   Smokeless tobacco: Never  Vaping Use   Vaping Use: Some days  Substance Use Topics   Alcohol use: Yes   Drug use: Never     Allergies   Patient has no known allergies.   Review of Systems Review of  Systems  Constitutional:  Negative for chills, diaphoresis, fatigue and fever.  HENT:  Positive for congestion and rhinorrhea. Negative for ear pain, sinus pressure, sinus pain and sore throat.   Respiratory:  Negative for cough and shortness of breath.   Gastrointestinal:  Positive for diarrhea, nausea and vomiting. Negative for abdominal pain.  Musculoskeletal:  Positive for arthralgias and joint swelling. Negative for myalgias.  Skin:  Positive for color change. Negative for rash.  Neurological:  Negative for weakness and headaches.  Hematological:  Negative for adenopathy.     Physical Exam Triage Vital Signs ED Triage Vitals  Enc Vitals Group     BP      Pulse      Resp      Temp       Temp src      SpO2      Weight      Height      Head Circumference      Peak Flow      Pain Score      Pain Loc      Pain Edu?      Excl. in [place]?    No data found.  Updated Vital Signs BP 115/89 (BP Location: Right Arm)   Pulse 82   Temp 98.2 F (36.8 C) (Oral)   Resp 14   Ht 5\' 7"  (1.702 m)   Wt 163 lb (73.9 kg)   SpO2 95%   BMI 25.53 kg/m   Physical Exam Vitals and nursing note reviewed.  Constitutional:      General: She is not in acute distress.    Appearance: Normal appearance. She is not ill-appearing or toxic-appearing.  HENT:     Head: Normocephalic and atraumatic.     Nose: Congestion present.     Mouth/Throat:     Mouth: Mucous membranes are moist.     Pharynx: Oropharynx is clear. No posterior oropharyngeal erythema.  Eyes:     General: No scleral icterus.       Right eye: No discharge.        Left eye: No discharge.     Conjunctiva/sclera: Conjunctivae normal.  Cardiovascular:     Rate and Rhythm: Normal rate and regular rhythm.     Heart sounds: Normal heart sounds.  Pulmonary:     Effort: Pulmonary effort is normal. No respiratory distress.     Breath sounds: Normal breath sounds.  Abdominal:     Palpations: Abdomen is soft.     Tenderness: There is no abdominal tenderness.  Musculoskeletal:     Cervical back: Neck supple.     Right knee: Swelling (mild swelling anterior knee) present. No effusion. Normal range of motion. Tenderness (diffusely of anterior knee joint) present. Normal pulse.     Comments: Small contusion anterior knee (R)  Skin:    General: Skin is dry.  Neurological:     General: No focal deficit present.     Mental Status: She is alert. Mental status is at baseline.     Motor: No weakness.     Gait: Gait normal.  Psychiatric:        Mood and Affect: Mood normal.        Behavior: Behavior normal.        Thought Content: Thought content normal.      UC Treatments / Results  Labs (all labs ordered are listed, but  only abnormal results are displayed) Labs Reviewed - No data to display  EKG  Radiology No results found.  Procedures Procedures (including critical care time)  Medications Ordered in UC Medications - No data to display  Initial Impression / Assessment and Plan / UC Course  I have reviewed the triage vital signs and the nursing notes.  Pertinent labs & imaging results that were available during my care of the patient were reviewed by me and considered in my medical decision making (see chart for details).   36 year old female presents for right knee pain that is worsened since injuring her knee on her car door.  She has a small bruise and swelling.  She has a history of chronic right knee pain and is starting PT tomorrow.  Also reporting congestion/runny nose, nausea/vomiting and diarrhea x2 days.  Vitals are normal and stable and she is overall well-appearing.  On exam she has minimal nasal congestion.  She has a small contusion and mild swelling of the right anterior knee before range of motion of the knee.  Tenderness throughout the anterior knee.  Normal gait.  Patient declines a respiratory panel.  States that she has a history of nosebleeds and does not want to be tested.  She says she would like to just be treated for what ever her daughter test positive for today.  Advised an x-ray on her knee since she has a small contusion.  She declines.  She says she think she just needs something for pain.  Advised patient to start PT.  Reviewed RICE guidelines.  Patient given supportive knee brace.  Sent prescription for naproxen to pharmacy.  Reviewed following up with Ortho if no improvement through PT or worsening of symptoms.  Patient's daughter is positive for influenza A.  Patient asked to just be treated.  I will treat her since she has been exposed and has symptoms.  Sent Tamiflu to pharmacy.  Also sent naproxen for her knee pain and Zofran for nausea and vomiting.  Reviewed  supportive care for flu symptoms.  Reviewed ED precautions for flu.   Final Clinical Impressions(s) / UC Diagnoses   Final diagnoses:  Contusion of right knee, initial encounter  Chronic pain of right knee  Nausea and vomiting, unspecified vomiting type  Nasal congestion  Influenza     Discharge Instructions      URI/COLD SYMPTOMS: Your exam today is consistent with a viral illness. Antibiotics are not indicated at this time. Use medications as directed, including cough syrup, nasal saline, and decongestants. Your symptoms should improve over the next few days and resolve within 7-10 days. Increase rest and fluids. F/u if symptoms worsen or predominate such as sore throat, ear pain, productive cough, shortness of breath, or if you develop high fevers or worsening fatigue over the next several days.    KNEE PAIN: Stressed avoiding painful activities . Reviewed RICE guidelines. Use medications as directed, including NSAIDs. If no NSAIDs have been prescribed for you today, you may take Aleve or Motrin over the counter. May use Tylenol in between doses of NSAIDs.  If no improvement in the next 1-2 weeks, f/u with PCP or return to our office for reexamination, and please feel free to call or return at any time for any questions or concerns you may have and we will be happy to help you!         ED Prescriptions     Medication Sig Dispense Auth. Provider   naproxen (NAPROSYN) 500 MG tablet Take 1 tablet (500 mg total) by mouth 2 (two) times daily with a  meal. 30 tablet Laurene Footman B, PA-C   ondansetron (ZOFRAN-ODT) 4 MG disintegrating tablet Take 1 tablet (4 mg total) by mouth every 8 (eight) hours as needed for nausea or vomiting. 15 tablet Danton Clap, PA-C   oseltamivir (TAMIFLU) 75 MG capsule Take 1 capsule (75 mg total) by mouth every 12 (twelve) hours for 5 days. 10 capsule Danton Clap, PA-C      I have reviewed the PDMP during this encounter.   Danton Clap,  PA-C 05/29/22 1617

## 2022-05-29 NOTE — Discharge Instructions (Addendum)
URI/COLD SYMPTOMS: Your exam today is consistent with a viral illness. Antibiotics are not indicated at this time. Use medications as directed, including cough syrup, nasal saline, and decongestants. Your symptoms should improve over the next few days and resolve within 7-10 days. Increase rest and fluids. F/u if symptoms worsen or predominate such as sore throat, ear pain, productive cough, shortness of breath, or if you develop high fevers or worsening fatigue over the next several days.    KNEE PAIN: Stressed avoiding painful activities . Reviewed RICE guidelines. Use medications as directed, including NSAIDs. If no NSAIDs have been prescribed for you today, you may take Aleve or Motrin over the counter. May use Tylenol in between doses of NSAIDs.  If no improvement in the next 1-2 weeks, f/u with PCP or return to our office for reexamination, and please feel free to call or return at any time for any questions or concerns you may have and we will be happy to help you!

## 2022-05-29 NOTE — ED Triage Notes (Signed)
Patient c/o pain and swelling that started getting worse yesterday in both knees.  Patient states that she will be starting physical therapy next week for her knees.

## 2022-06-16 ENCOUNTER — Telehealth: Payer: Self-pay

## 2022-06-16 ENCOUNTER — Ambulatory Visit
Admission: EM | Admit: 2022-06-16 | Discharge: 2022-06-16 | Disposition: A | Discharge status: Home / Self Care | Payer: Medicaid Other | Attending: Emergency Medicine | Admitting: Emergency Medicine

## 2022-06-16 DIAGNOSIS — J029 Acute pharyngitis, unspecified: Secondary | ICD-10-CM | POA: Insufficient documentation

## 2022-06-16 DIAGNOSIS — B3731 Acute candidiasis of vulva and vagina: Secondary | ICD-10-CM | POA: Insufficient documentation

## 2022-06-16 DIAGNOSIS — T7840XA Allergy, unspecified, initial encounter: Secondary | ICD-10-CM | POA: Insufficient documentation

## 2022-06-16 DIAGNOSIS — R102 Pelvic and perineal pain: Secondary | ICD-10-CM | POA: Insufficient documentation

## 2022-06-16 LAB — URINALYSIS, ROUTINE W REFLEX MICROSCOPIC
Bilirubin Urine: NEGATIVE
Glucose, UA: NEGATIVE mg/dL
Hgb urine dipstick: NEGATIVE
Ketones, ur: NEGATIVE mg/dL
Leukocytes,Ua: NEGATIVE
Nitrite: NEGATIVE
Protein, ur: NEGATIVE mg/dL
Specific Gravity, Urine: >1.03 — ABNORMAL HIGH (ref 1.005–1.030)
pH: 6 (ref 5.0–8.0)

## 2022-06-16 LAB — WET PREP, GENITAL
Clue Cells Wet Prep HPF POC: NONE SEEN
Sperm: NONE SEEN
Trich, Wet Prep: NONE SEEN
WBC, Wet Prep HPF POC: >10 — AB (ref <10)

## 2022-06-16 MED ORDER — LIDOCAINE VISCOUS HCL 2 % MT SOLN
15.0000 mL | Freq: Once | OROMUCOSAL | Status: AC
  Administered 2022-06-16: 15 mL via OROMUCOSAL

## 2022-06-16 MED ORDER — MICONAZOLE NITRATE 2 % EX CREA: 1.0000 | TOPICAL_CREAM | Freq: Two times a day (BID) | CUTANEOUS | 0 refills | Status: DC

## 2022-06-16 MED ORDER — DIPHENHYDRAMINE HCL 12.5 MG/5ML PO ELIX
12.5000 mg | ORAL_SOLUTION | Freq: Once | ORAL | Status: AC
  Administered 2022-06-16: 12.5 mg via ORAL

## 2022-06-16 MED ORDER — FLUCONAZOLE 150 MG PO TABS: 150.0000 mg | ORAL_TABLET | Freq: Once | ORAL | 1 refills | Status: AC

## 2022-06-16 MED ORDER — HYDROCORTISONE 1 % EX OINT: 1.0000 | TOPICAL_OINTMENT | Freq: Two times a day (BID) | CUTANEOUS | 0 refills | Status: DC

## 2022-06-16 MED ORDER — ALUM & MAG HYDROXIDE-SIMETH 200-200-20 MG/5ML PO SUSP
15.0000 mL | Freq: Once | ORAL | Status: AC
  Administered 2022-06-16: 15 mL via ORAL

## 2022-06-16 NOTE — Telephone Encounter (Signed)
Calling Patient to give her there results of her Wet Prep. Test was positive for a yeast infection and Diflucan was sent in to the pharmacy. Left message to return call.

## 2022-06-16 NOTE — Discharge Instructions (Signed)
Stop the Bactrim.  Do not ever take it again.  I suspect her symptoms are coming from the Bactrim.  You can put miconazole and hydrocortisone cream on the labial irritation, take sitz baths when you urinate so that it does not hurt and prevent urinary retention.  I have prescribed Diflucan in case your labial irritation is from a yeast infection.  Go to the [facility] or [facility] emergency department if the sore throat, rash, vaginal pain gets worse, fevers above 100.4, or for any other concerns.   Some people find salt water gargles and  Traditional Medicinal's "Throat Coat" tea helpful. Take 5 mL of liquid Benadryl and 5 mL of Maalox. Mix it together, and then hold it in your mouth for as long as you can and then swallow. You may do this 4 times a day.  Honey and lemon dissolved in hot water can also be soothing.  Go to www.goodrx.com  or www.costplusdrugs.com to look up your medications. This will give you a list of where you can find your prescriptions at the most affordable prices. Or ask the pharmacist what the cash price is, or if they have any other discount programs available to help make your medication more affordable. This can be less expensive than what you would pay with insurance.

## 2022-06-16 NOTE — ED Triage Notes (Signed)
Chief Complaint: Was given UTI medication. States has patchy irritated skin in the vaginal area to the point of bleeding. Sharp pain when swallowing. Itchy hands and feet.   Onset: started Bactrim 2 days ago.

## 2022-06-16 NOTE — ED Provider Notes (Signed)
HPI  SUBJECTIVE:  Jasmin Robinson is a 36 y.o. female who presents with sore throat, and a painful rash on her hands, dysuria, vulvar swelling, vaginal irritation, clear vaginal discharge dysuria, urinary urgency, frequency, cloudy urine starting yesterday.  She is on Bactrim 2 out of 3 for UTI.  She denies rash elsewhere, sores or ulcers in her mouth, chapped lips.  She states that it seems that her fingers are shedding skin in layers.  She states her tongue feels swollen, but denies angioedema of the lips, wheezing, shortness of breath, difficulty breathing.  No odorous urine, hematuria.  No eye pain, fevers.  She has never been on Bactrim before.  No other new medications.  She has tried rubbing alcohol in her vagina which made her symptoms worse.  She has also been putting baby powder on her vagina.  She has a past medical history of eczema on her hands, but states that it is usually in the webbing.  She states this is different.  She also has a past medical history of asthma, UTI.  She was treated for influenza A 2 weeks ago.      Past Medical History:  Diagnosis Date   Anxiety    Migraines     Past Surgical History:  Procedure Laterality Date   CESAREAN SECTION      History reviewed. No pertinent family history.  Social History   Tobacco Use   Smoking status: Every Day    Types: Cigarettes   Smokeless tobacco: Never  Vaping Use   Vaping Use: Some days  Substance Use Topics   Alcohol use: Yes   Drug use: Never    No current facility-administered medications for this encounter.  Current Outpatient Medications:    fluconazole (DIFLUCAN) 150 MG tablet, Take 1 tablet (150 mg total) by mouth once for 1 dose. 1 tab po x 1. May repeat in 72 hours if no improvement, Disp: 2 tablet, Rfl: 1   gabapentin (NEURONTIN) 600 MG tablet, TAKE 1 TABLET BY MOUTH FOUR TIMES DAILY WITH MEALS, Disp: , Rfl:    hydrocortisone 1 % ointment, Apply 1 Application topically 2 (two) times daily., Disp:  30 g, Rfl: 0   miconazole (MICOTIN) 2 % cream, Apply 1 Application topically 2 (two) times daily., Disp: 28.35 g, Rfl: 0   albuterol (PROVENTIL HFA;VENTOLIN HFA) 108 (90 Base) MCG/ACT inhaler, Inhale into the lungs every 6 (six) hours as needed for wheezing or shortness of breath., Disp: , Rfl:    busPIRone (BUSPAR) 7.5 MG tablet, Take 1 tablet (7.5 mg total) by mouth 2 (two) times daily., Disp: 60 tablet, Rfl: 1   cyclobenzaprine (FLEXERIL) 10 MG tablet, Take 1 tablet (10 mg total) by mouth at bedtime., Disp: 20 tablet, Rfl: 0   fluticasone (FLONASE) 50 MCG/ACT nasal spray, Place 2 sprays into both nostrils daily., Disp: 16 g, Rfl: 0   hydrOXYzine (ATARAX) 25 MG tablet, Take 1 tablet (25 mg total) by mouth 3 (three) times daily as needed for anxiety., Disp: 20 tablet, Rfl: 0   naproxen (NAPROSYN) 500 MG tablet, Take 1 tablet (500 mg total) by mouth 2 (two) times daily with a meal., Disp: 30 tablet, Rfl: 0   ondansetron (ZOFRAN-ODT) 4 MG disintegrating tablet, Take 1 tablet (4 mg total) by mouth every 8 (eight) hours as needed for nausea or vomiting., Disp: 15 tablet, Rfl: 0  No Known Allergies   ROS  As noted in HPI.   Physical Exam  BP 111/62 (BP Location:  Left Arm)   Pulse 61   Resp 16   SpO2 95%   Constitutional: Well developed, well nourished, no acute distress Eyes:  EOMI, conjunctiva normal bilaterally. HENT: Normocephalic, atraumatic,mucus membranes moist.  Slightly chapped lips..  No ulcers, peeling skin.  Erythematous oropharynx.  Tonsils normal size without exudates.  No intraoral ulcers. Neck: No cervical lymphadenopathy Respiratory: Normal inspiratory effort Cardiovascular: Normal rate GI: nondistended skin: Erythematous tender rash, dry skin left little finger  Tender, questionably peeling skin at two other fingertips     Musculoskeletal: no deformities GU: Erythematous, irritated inner aspect of external labia, inner labia.  No ulcers, blisters, active  bleeding. Neurologic: Alert & oriented x 3, no focal neuro deficits Psychiatric: Speech and behavior appropriate   ED Course   Medications  diphenhydrAMINE (BENADRYL) 12.5 MG/5ML elixir 12.5 mg (12.5 mg Oral Given 06/16/22 1017)  alum & mag hydroxide-simeth (MAALOX/MYLANTA) 200-200-20 MG/5ML suspension 15 mL (15 mLs Oral Given 06/16/22 1017)  lidocaine (XYLOCAINE) 2 % viscous mouth solution 15 mL (15 mLs Mouth/Throat Given 06/16/22 1017)    Orders Placed This Encounter  Procedures   Pelvic exam    Standing Status:   Standing    Number of Occurrences:   1   Wet prep, genital    Standing Status:   Standing    Number of Occurrences:   1   Urinalysis, Routine w reflex microscopic Urine, Clean Catch    Standing Status:   Standing    Number of Occurrences:   1    Results for orders placed or performed during the hospital encounter of 06/16/22 (from the past 24 hour(s))  Urinalysis, Routine w reflex microscopic Urine, Clean Catch     Status: Abnormal   Collection Time: 06/16/22 10:08 AM  Result Value Ref Range   Color, Urine YELLOW YELLOW   APPearance CLEAR CLEAR   Specific Gravity, Urine >1.030 (H) 1.005 - 1.030   pH 6.0 5.0 - 8.0   Glucose, UA NEGATIVE NEGATIVE mg/dL   Hgb urine dipstick NEGATIVE NEGATIVE   Bilirubin Urine NEGATIVE NEGATIVE   Ketones, ur NEGATIVE NEGATIVE mg/dL   Protein, ur NEGATIVE NEGATIVE mg/dL   Nitrite NEGATIVE NEGATIVE   Leukocytes,Ua NEGATIVE NEGATIVE  Wet prep, genital     Status: Abnormal   Collection Time: 06/16/22 10:34 AM   Specimen: Vaginal  Result Value Ref Range   Yeast Wet Prep HPF POC PRESENT (A) NONE SEEN   Trich, Wet Prep NONE SEEN NONE SEEN   Clue Cells Wet Prep HPF POC NONE SEEN NONE SEEN   WBC, Wet Prep HPF POC >10 (A) <10   Sperm NONE SEEN    No results found.  ED Clinical Impression  1. Sore throat   2. Allergic reaction, initial encounter   3. Vaginal pain   4. Yeast vaginitis      ED Assessment/Plan     Previous  records reviewed.  As noted in HPI.  Concern for early Stevens-Johnson syndrome with a sore throat, vaginal involvement and painful rash on her fingers and the recent treatment with Bactrim.  There does not appear to be any ocular involvement.  Patient states the eczema is usually located between her fingers, not on her fingertips.  There is no evidence of oral involvement at this time.  I do not see any evidence of other rash or blistering at this time.  Will discontinue the Bactrim immediately.  Checking UA to ensure resolution of UTI.  UA negative for urinary tract infection.  Will not prescribe further antibiotics.  Vaginal irritation  could be a yeast infection.  Wet prep positive for yeast.  Supportive treatment for now.  Hydrocortisone/miconazole combination to put on the labia, sitz bath's to prevent urinary retention, Benadryl/Maalox mixture for the throat, Diflucan.  Staff notified patient of positive yeast results.  Patient is to follow-up with her primary care provider as needed.  She is to go to the [facility] or [facility] ER immediately if anything gets worse or for any concerns.  Discussed labs,  MDM, treatment plan, and plan for follow-up with patient. Discussed sn/sx that should prompt return to the ED. patient agrees with plan.   Meds ordered this encounter  Medications   diphenhydrAMINE (BENADRYL) 12.5 MG/5ML elixir 12.5 mg   alum & mag hydroxide-simeth (MAALOX/MYLANTA) 200-200-20 MG/5ML suspension 15 mL   lidocaine (XYLOCAINE) 2 % viscous mouth solution 15 mL   fluconazole (DIFLUCAN) 150 MG tablet    Sig: Take 1 tablet (150 mg total) by mouth once for 1 dose. 1 tab po x 1. May repeat in 72 hours if no improvement    Dispense:  2 tablet    Refill:  1   miconazole (MICOTIN) 2 % cream    Sig: Apply 1 Application topically 2 (two) times daily.    Dispense:  28.35 g    Refill:  0   hydrocortisone 1 % ointment    Sig: Apply 1 Application topically 2 (two) times daily.    Dispense:  30 g     Refill:  0      *This clinic note was created using Scientist, clinical (histocompatibility and immunogenetics). Therefore, there may be occasional mistakes despite careful proofreading.  ?    Domenick Gong, MD 06/17/22 [phone]

## 2022-06-17 ENCOUNTER — Encounter: Payer: Self-pay | Admitting: Emergency Medicine

## 2022-06-17 ENCOUNTER — Ambulatory Visit
Admission: EM | Admit: 2022-06-17 | Discharge: 2022-06-17 | Disposition: A | Discharge status: Home / Self Care | Payer: Medicaid Other | Attending: Emergency Medicine | Admitting: Emergency Medicine

## 2022-06-17 DIAGNOSIS — J029 Acute pharyngitis, unspecified: Secondary | ICD-10-CM | POA: Diagnosis not present

## 2022-06-17 DIAGNOSIS — K1370 Unspecified lesions of oral mucosa: Secondary | ICD-10-CM

## 2022-06-17 DIAGNOSIS — K1379 Other lesions of oral mucosa: Secondary | ICD-10-CM | POA: Diagnosis not present

## 2022-06-17 MED ORDER — LIDOCAINE VISCOUS HCL 2 % MT SOLN
10.0000 mL | Freq: Once | OROMUCOSAL | Status: AC
  Administered 2022-06-17: 10 mL via ORAL

## 2022-06-17 MED ORDER — ALUM & MAG HYDROXIDE-SIMETH 200-200-20 MG/5ML PO SUSP
10.0000 mL | Freq: Once | ORAL | Status: AC
  Administered 2022-06-17: 10 mL via ORAL

## 2022-06-17 MED ORDER — DIPHENHYDRAMINE HCL 12.5 MG/5ML PO ELIX
12.5000 mg | ORAL_SOLUTION | Freq: Once | ORAL | Status: AC
  Administered 2022-06-17: 12.5 mg via ORAL

## 2022-06-17 NOTE — ED Notes (Signed)
Patient is being discharged from the Urgent Care and sent to the Emergency Department via POV with family . Per Chaney Malling, MD patient is in need of higher level of care due to rule out steven johnson syndrome. Patient is aware and verbalizes understanding of plan of care.  Vitals:   06/17/22 1933  BP: 126/76  Pulse: 73  Resp: 16  Temp: 98.1 F (36.7 C)  SpO2: 100%

## 2022-06-17 NOTE — Discharge Instructions (Signed)
This could be a primary herpes infection, but I am concerned about Stevens-Johnson syndrome with your recent Bactrim use.  Go immediately to the [facility] or [facility] emergency department.  Let them know if your sore throat gets worse, you start having difficulty breathing, or for any concerns.

## 2022-06-17 NOTE — ED Triage Notes (Signed)
Pt c/o mouth lesions. Started 2 days ago. She states she is having an allergic reaction to medications she was prescribed yesterday. Pt is hard to understand as she can barely talk. She states she feels like her throat is tight. She states she stopped taking the medication 2 days ago but was told to stop yesterday.

## 2022-06-17 NOTE — ED Provider Notes (Signed)
HPI  SUBJECTIVE:  Jasmin Robinson is a 36 y.o. female who presents with worsening sore throat and mouth lesions starting yesterday.  She was seen here for sore throat, a painful, pruritic rash on her hands, vaginal pain, itching, dysuria.  She was on day number 2 out of 3 of Bactrim for UTI.  She was able to open up her mouth fully, she had no intraoral oral ulcers, she had normal tonsils without exudates.  She had labial erythema and irritation, but no ulcers.  She was found to have a vaginal yeast infection, started on cortisone/miconazole combination, and Diflucan.  There was concern for possible early Stevens-Johnson syndrome, but there was no ocular or airway involvement.  she was advised to stop the Bactrim immediately, and to follow-up as needed, or to go to the ER if she got worse.  She states that she is unable to open up her mouth fully today.  She reports tender, swollen cervical and submandibular lymphadenopathy, pain with neck flexion/extension, facial swelling, inability to eat secondary to pain.  She states she feels like her throat is very swollen, but denies difficulty breathing.  No fevers.  She states that the rash on her hands and her vaginal issues are getting better.  He has tried Benadryl and cool compresses without improvement in her symptoms.  Symptoms are worse when she tries to swallow, talks, with opening her mouth, neck flexion.  She denies history of HSV infection.   Past Medical History:  Diagnosis Date   Anxiety    Migraines     Past Surgical History:  Procedure Laterality Date   CESAREAN SECTION      History reviewed. No pertinent family history.  Social History   Tobacco Use   Smoking status: Every Day    Types: Cigarettes   Smokeless tobacco: Never  Vaping Use   Vaping Use: Some days  Substance Use Topics   Alcohol use: Yes   Drug use: Never    No current facility-administered medications for this encounter.  Current Outpatient Medications:     albuterol (PROVENTIL HFA;VENTOLIN HFA) 108 (90 Base) MCG/ACT inhaler, Inhale into the lungs every 6 (six) hours as needed for wheezing or shortness of breath., Disp: , Rfl:    busPIRone (BUSPAR) 7.5 MG tablet, Take 1 tablet (7.5 mg total) by mouth 2 (two) times daily., Disp: 60 tablet, Rfl: 1   cyclobenzaprine (FLEXERIL) 10 MG tablet, Take 1 tablet (10 mg total) by mouth at bedtime., Disp: 20 tablet, Rfl: 0   fluticasone (FLONASE) 50 MCG/ACT nasal spray, Place 2 sprays into both nostrils daily., Disp: 16 g, Rfl: 0   gabapentin (NEURONTIN) 600 MG tablet, TAKE 1 TABLET BY MOUTH FOUR TIMES DAILY WITH MEALS, Disp: , Rfl:    hydrocortisone 1 % ointment, Apply 1 Application topically 2 (two) times daily., Disp: 30 g, Rfl: 0   hydrOXYzine (ATARAX) 25 MG tablet, Take 1 tablet (25 mg total) by mouth 3 (three) times daily as needed for anxiety., Disp: 20 tablet, Rfl: 0   miconazole (MICOTIN) 2 % cream, Apply 1 Application topically 2 (two) times daily., Disp: 28.35 g, Rfl: 0   naproxen (NAPROSYN) 500 MG tablet, Take 1 tablet (500 mg total) by mouth 2 (two) times daily with a meal., Disp: 30 tablet, Rfl: 0   ondansetron (ZOFRAN-ODT) 4 MG disintegrating tablet, Take 1 tablet (4 mg total) by mouth every 8 (eight) hours as needed for nausea or vomiting., Disp: 15 tablet, Rfl: 0  No Known Allergies  ROS  As noted in HPI.   Physical Exam  BP 126/76 (BP Location: Right Arm)   Pulse 73   Temp 98.1 F (36.7 C) (Oral)   Resp 16   Ht 5\' 7"  (1.702 m)   Wt 73.9 kg   SpO2 100%   BMI 25.52 kg/m   Constitutional: Well developed, well nourished, appears to be in moderate amount of pain. Eyes:  EOMI, conjunctiva normal bilaterally HENT: Normocephalic, atraumatic, chapped lips.  Positive large ulcer in the left cheek and multiple lesions along the upper and lower gums.  Mild trismus.  No drooling.  No stridor.  No lesions on the tongue.    Neck: Positive tender cervical and submandibular lymphadenopathy.   No neck stiffness. Respiratory: Normal inspiratory effort Cardiovascular: Normal rate GI: nondistended skin: No rash, skin intact Musculoskeletal: no deformities Neurologic: Alert & oriented x 3, no focal neuro deficits Psychiatric: Speech and behavior appropriate   ED Course   Medications  diphenhydrAMINE (BENADRYL) 12.5 MG/5ML elixir 12.5 mg (12.5 mg Oral Given 06/17/22 2113)  alum & mag hydroxide-simeth (MAALOX/MYLANTA) 200-200-20 MG/5ML suspension 10 mL (10 mLs Oral Given 06/17/22 2114)    And  lidocaine (XYLOCAINE) 2 % viscous mouth solution 10 mL (10 mLs Oral Given 06/17/22 2115)    No orders of the defined types were placed in this encounter.   No results found for this or any previous visit (from the past 24 hour(s)). No results found.  ED Clinical Impression  1. Mouth lesion   2. Sore throat   3. Mouth pain      ED Assessment/Plan     This could be primary HSV infection, but with the recent Bactrim use, continued concern for 2116 syndrome.  Does not appear to be thrush.  Doubt epiglottitis.  Giving Benadryl/Maalox/lidocaine mixture, transferring to the [facility] or [facility] emergency department for further evaluation and possible specialty consultation.  She is stable to go via private vehicle.  Discussed rationale for transfer to the emergency department with the patient.  She agrees to go.  Meds ordered this encounter  Medications   diphenhydrAMINE (BENADRYL) 12.5 MG/5ML elixir 12.5 mg   AND Linked Order Group    alum & mag hydroxide-simeth (MAALOX/MYLANTA) 200-200-20 MG/5ML suspension 10 mL    lidocaine (XYLOCAINE) 2 % viscous mouth solution 10 mL     *This clinic note was created using 01-18-2001. Therefore, there may be occasional mistakes despite careful proofreading.  ?    Scientist, clinical (histocompatibility and immunogenetics), MD 06/17/22 2125

## 2022-07-30 ENCOUNTER — Ambulatory Visit
Admission: EM | Admit: 2022-07-30 | Discharge: 2022-07-30 | Disposition: A | Discharge status: Home / Self Care | Payer: Medicaid Other | Attending: Emergency Medicine | Admitting: Emergency Medicine

## 2022-07-30 ENCOUNTER — Encounter: Payer: Self-pay | Admitting: Emergency Medicine

## 2022-07-30 DIAGNOSIS — K219 Gastro-esophageal reflux disease without esophagitis: Secondary | ICD-10-CM | POA: Diagnosis not present

## 2022-07-30 DIAGNOSIS — H109 Unspecified conjunctivitis: Secondary | ICD-10-CM

## 2022-07-30 HISTORY — DX: Fibromyalgia: M79.7

## 2022-07-30 MED ORDER — ALUM & MAG HYDROXIDE-SIMETH 400-400-40 MG/5ML PO SUSP: 5.0000 mL | Freq: Four times a day (QID) | ORAL | 0 refills | Status: DC | PRN

## 2022-07-30 MED ORDER — ERYTHROMYCIN 5 MG/GM OP OINT: TOPICAL_OINTMENT | OPHTHALMIC | 0 refills | Status: DC

## 2022-07-30 MED ORDER — POLYETHYLENE GLYCOL 3350 17 GM/SCOOP PO POWD: 1.0000 | Freq: Once | ORAL | 0 refills | Status: AC

## 2022-07-30 NOTE — Discharge Instructions (Signed)
Today you are being treated for acid reflux -Your Merril Abbe has been refilled as a container, you will be able to scoop out medicine, continue to take daily as recommended -You may use Maalox every 6 hours as needed to help with gas, take at least 30 minutes before attempting to eat or drink to help reduce flares -While symptoms are present you will need to eat a bland diet, avoid spicy or greasy foods to avoid further stomach irritation  For your eye -Puslike drainage is a sign of infection therefore begin erythromycin ointment every morning and every evening for 7 days, symptoms should begin to clear in about 24 hours -For dry eyes you may use over-the-counter refresh eyedrops which is moisture -Avoid eye touching and rubbing to prevent further contamination and spread -May follow-up if symptoms persist  For your ears -No signs of infection on exam, possibly related to the sinuses, you may begin any of the over-the-counter congestion medications for management as well as nasal sprays  For your physical therapy please follow-up with your orthopedic doctor so that referral may be sent to the needed office

## 2022-07-30 NOTE — ED Provider Notes (Signed)
[facility]    CSN: [id number] Arrival date & time: 07/30/22  1058      History   Chief Complaint Chief Complaint  Patient presents with   Weakness    HPI Jasmin Robinson is a 37 y.o. female.   Patient presents requesting medication refill.  Endorses she had rectal prolapse surgery in November 2023, supposed to be taking MiraLAX daily to prevent straining, endorses that she has not been using consistently if she is unable to read packages open at home.  Does endorse abdominal bloating and increased gas production worsened after eating.  Last bowel movement within the last 2 to 3 days, denies constipation, nausea, vomiting, fevers, URI symptoms.  Endorses puslike drainage from the right eye present for 1 month with associated pruritus and intermittent blurred vision.  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  Has attempted use of over-the-counter eyedrops which have been minimally effective.  Denies light sensitivity redness.      Past Medical History:  Diagnosis Date   Anxiety    Fibromyalgia    Migraines     There are no problems to display for this patient.   Past Surgical History:  Procedure Laterality Date   ABDOMINAL SURGERY     CESAREAN SECTION      OB History   No obstetric history on file.      Home Medications    Prior to Admission medications   Medication Sig Start Date End Date Taking? Authorizing Provider  albuterol (PROVENTIL HFA;VENTOLIN HFA) 108 (90 Base) MCG/ACT inhaler Inhale into the lungs every 6 (six) hours as needed for wheezing or shortness of breath.    [provider]  busPIRone (BUSPAR) 7.5 MG tablet Take 1 tablet (7.5 mg total) by mouth 2 (two) times daily. 06/22/21   Becky Augusta, NP  cyclobenzaprine (FLEXERIL) 10 MG tablet Take 1 tablet (10 mg total) by mouth at bedtime. 02/12/21   Domenick Gong, MD  fluticasone (FLONASE) 50 MCG/ACT nasal spray Place 2 sprays  into both nostrils daily. 02/12/21   Domenick Gong, MD  gabapentin (NEURONTIN) 600 MG tablet TAKE 1 TABLET BY MOUTH FOUR TIMES DAILY WITH MEALS 12/03/19   [provider]  hydrocortisone 1 % ointment Apply 1 Application topically 2 (two) times daily. 06/16/22   Domenick Gong, MD  hydrOXYzine (ATARAX) 25 MG tablet Take 1 tablet (25 mg total) by mouth 3 (three) times daily as needed for anxiety. 01/18/22   Minna Antis, MD  miconazole (MICOTIN) 2 % cream Apply 1 Application topically 2 (two) times daily. 06/16/22   Domenick Gong, MD  naproxen (NAPROSYN) 500 MG tablet Take 1 tablet (500 mg total) by mouth 2 (two) times daily with a meal. 05/29/22   Eusebio Friendly B, PA-C  ondansetron (ZOFRAN-ODT) 4 MG disintegrating tablet Take 1 tablet (4 mg total) by mouth every 8 (eight) hours as needed for nausea or vomiting. 05/29/22   Shirlee Latch, PA-C  citalopram (CELEXA) 10 MG tablet Take 10 mg by mouth daily.  01/20/19  [provider]  topiramate (TOPAMAX) 25 MG capsule Take 25 mg by mouth 2 (two) times daily.  01/20/19  [provider]    Family History History reviewed. No pertinent family history.  Social History Social History   Tobacco Use   Smoking status: Every Day    Types: Cigarettes   Smokeless tobacco: Never  Vaping Use   Vaping Use: Some days  Substance Use Topics   Alcohol  use: Yes   Drug use: Never     Allergies   Sulfa antibiotics   Review of Systems Review of Systems  Neurological:  Positive for weakness.     Physical Exam Triage Vital Signs ED Triage Vitals  Enc Vitals Group     BP 07/30/22 1123 122/61     Pulse Rate 07/30/22 1123 64     Resp 07/30/22 1123 14     Temp 07/30/22 1123 97.9 F (36.6 C)     Temp Source 07/30/22 1123 Oral     SpO2 07/30/22 1123 100 %     Weight 07/30/22 1120 162 lb 14.7 oz (73.9 kg)     Height 07/30/22 1120 5\' 7"  (1.702 m)     Head Circumference --      Peak Flow --      Pain Score  07/30/22 1120 0     Pain Loc --      Pain Edu? --      Excl. in [place]? --    No data found.  Updated Vital Signs BP 122/61 (BP Location: Left Arm)   Pulse 64   Temp 97.9 F (36.6 C) (Oral)   Resp 14   Ht 5\' 7"  (1.702 m)   Wt 162 lb 14.7 oz (73.9 kg)   SpO2 100%   BMI 25.52 kg/m   Visual Acuity Right Eye Distance:   Left Eye Distance:   Bilateral Distance:    Right Eye Near:   Left Eye Near:    Bilateral Near:     Physical Exam Constitutional:      Appearance: Normal appearance.  Eyes:     Extraocular Movements: Extraocular movements intact.     Comments: Noted on exam, no erythema to the conjunctiva, vision is grossly intact, extraocular movements intact  Pulmonary:     Effort: Pulmonary effort is normal.  Abdominal:     General: Abdomen is flat. Bowel sounds are normal. There is no distension.     Palpations: Abdomen is soft.     Tenderness: There is no abdominal tenderness.  Neurological:     Mental Status: She is alert and oriented to person, place, and time. Mental status is at baseline.      UC Treatments / Results  Labs (all labs ordered are listed, but only abnormal results are displayed) Labs Reviewed - No data to display  EKG   Radiology No results found.  Procedures Procedures (including critical care time)  Medications Ordered in UC Medications - No data to display  Initial Impression / Assessment and Plan / UC Course  I have reviewed the triage vital signs and the nursing notes.  Pertinent labs & imaging results that were available during my care of the patient were reviewed by me and considered in my medical decision making (see chart for details).  GERD without esophagitis, bacterial conjunctivitis of right eye  Description of abdominal pain worsened by eating is consistent with indigestion, discussed with patient, as symptoms have been present for greater than 1 month low suspicion for acute abdomen, MiraLAX in a container prescribed  as well as Maalox for outpatient management, recommended a bland diet and increase fluid intake until symptoms have resolved, may follow-up as needed if symptoms persist or worsen  Even though no abnormalities noted on exam, etiology is most likely bacterial due to puslike drainage, erythromycin ointment prescribed and discussed administration, advised against attempting or rubbing to prevent spread in contamination, may follow-up with urgent care as needed Final  Clinical Impressions(s) / UC Diagnoses   Final diagnoses:  None   Discharge Instructions   None    ED Prescriptions   None    PDMP not reviewed this encounter.   Hans Eden, NP 07/30/22 1710

## 2022-07-30 NOTE — ED Triage Notes (Signed)
Patient states that she had surgery in September for Rectal Prolapse.  Patient states that 2 months later she had the flu.  Patient reports weakness, sinus congestion and pressure that started four months ago.  Patient denies fevers.

## 2022-09-13 ENCOUNTER — Ambulatory Visit
Admission: EM | Admit: 2022-09-13 | Discharge: 2022-09-13 | Disposition: A | Discharge status: Home / Self Care | Payer: Medicaid Other | Attending: Emergency Medicine | Admitting: Emergency Medicine

## 2022-09-13 ENCOUNTER — Encounter: Payer: Self-pay | Admitting: Emergency Medicine

## 2022-09-13 DIAGNOSIS — J0140 Acute pansinusitis, unspecified: Secondary | ICD-10-CM

## 2022-09-13 DIAGNOSIS — K5900 Constipation, unspecified: Secondary | ICD-10-CM | POA: Diagnosis not present

## 2022-09-13 MED ORDER — AMOXICILLIN-POT CLAVULANATE 875-125 MG PO TABS: 1.0000 | ORAL_TABLET | Freq: Two times a day (BID) | ORAL | 0 refills | Status: AC

## 2022-09-13 MED ORDER — POLYETHYLENE GLYCOL 3350 17 G PO PACK: 17.0000 g | PACK | Freq: Every day | ORAL | 0 refills | Status: DC

## 2022-09-13 NOTE — ED Triage Notes (Signed)
Pt states her nasal passage and her throat feels dry for several weeks.

## 2022-09-13 NOTE — ED Provider Notes (Signed)
[facility]    CSN: [id number] Arrival date & time: 09/13/22  1038      History   Chief Complaint Chief Complaint  Patient presents with   Nasal Congestion    HPI Jasmin Robinson is a 37 y.o. female.   HPI  37 year old female here for evaluation of respiratory complaints.  Patient has a past medical history significant for anxiety, migraines, and fibromyalgia as well as cesarean section and abdominal surgery for bowel blockage presenting for evaluation of 2 weeks worth of nasal congestion with yellow nasal discharge, subjective fever, ringing in her right ear, sore throat, and a nonproductive cough.  She also had some nausea, vomiting, and diarrhea at the beginning of her symptoms but that has resolved.  She endorses headache and fatigue but denies shortness of breath or wheezing.  Past Medical History:  Diagnosis Date   Anxiety    Fibromyalgia    Migraines     There are no problems to display for this patient.   Past Surgical History:  Procedure Laterality Date   ABDOMINAL SURGERY     CESAREAN SECTION      OB History   No obstetric history on file.      Home Medications    Prior to Admission medications   Medication Sig Start Date End Date Taking? Authorizing Provider  amoxicillin-clavulanate (AUGMENTIN) 875-125 MG tablet Take 1 tablet by mouth every 12 (twelve) hours for 10 days. 09/13/22 09/23/22 Yes Margarette Canada, NP  polyethylene glycol (MIRALAX) 17 g packet Take 17 g by mouth daily. 09/13/22  Yes Margarette Canada, NP  albuterol (PROVENTIL HFA;VENTOLIN HFA) 108 (90 Base) MCG/ACT inhaler Inhale into the lungs every 6 (six) hours as needed for wheezing or shortness of breath.    [provider]  alum & mag hydroxide-simeth (MAALOX PLUS) 400-400-40 MG/5ML suspension Take 5 mLs by mouth every 6 (six) hours as needed for indigestion. 07/30/22   White, Leitha Schuller, NP  busPIRone (BUSPAR) 7.5 MG tablet Take 1 tablet (7.5 mg total) by mouth 2 (two) times  daily. 06/22/21   Margarette Canada, NP  cyclobenzaprine (FLEXERIL) 10 MG tablet Take 1 tablet (10 mg total) by mouth at bedtime. 02/12/21   Melynda Ripple, MD  erythromycin ophthalmic ointment Place a 1/2 inch ribbon of ointment into the lower eyelid. Twice a day for 7 days 07/30/22   Hans Eden, NP  fluticasone (FLONASE) 50 MCG/ACT nasal spray Place 2 sprays into both nostrils daily. 02/12/21   Melynda Ripple, MD  gabapentin (NEURONTIN) 600 MG tablet TAKE 1 TABLET BY MOUTH FOUR TIMES DAILY WITH MEALS 12/03/19   [provider]  hydrocortisone 1 % ointment Apply 1 Application topically 2 (two) times daily. 06/16/22   Melynda Ripple, MD  hydrOXYzine (ATARAX) 25 MG tablet Take 1 tablet (25 mg total) by mouth 3 (three) times daily as needed for anxiety. 01/18/22   Harvest Dark, MD  miconazole (MICOTIN) 2 % cream Apply 1 Application topically 2 (two) times daily. 06/16/22   Melynda Ripple, MD  naproxen (NAPROSYN) 500 MG tablet Take 1 tablet (500 mg total) by mouth 2 (two) times daily with a meal. 05/29/22   Laurene Footman B, PA-C  ondansetron (ZOFRAN-ODT) 4 MG disintegrating tablet Take 1 tablet (4 mg total) by mouth every 8 (eight) hours as needed for nausea or vomiting. 05/29/22   Danton Clap, PA-C  citalopram (CELEXA) 10 MG tablet Take 10 mg by mouth daily.  01/20/19  [provider]  topiramate (TOPAMAX)  25 MG capsule Take 25 mg by mouth 2 (two) times daily.  01/20/19  [provider]    Family History History reviewed. No pertinent family history.  Social History Social History   Tobacco Use   Smoking status: Every Day    Types: Cigarettes   Smokeless tobacco: Never  Vaping Use   Vaping Use: Some days  Substance Use Topics   Alcohol use: Yes   Drug use: Never     Allergies   Sulfa antibiotics   Review of Systems Review of Systems  Constitutional:  Positive for fatigue and fever.  HENT:  Positive for congestion, rhinorrhea, sinus  pressure, sore throat and tinnitus.   Respiratory:  Positive for cough. Negative for shortness of breath and wheezing.   Gastrointestinal:  Positive for diarrhea, nausea and vomiting.  Neurological:  Positive for headaches.     Physical Exam Triage Vital Signs ED Triage Vitals  Enc Vitals Group     BP 09/13/22 1054 121/76     Pulse Rate 09/13/22 1054 (!) 56     Resp 09/13/22 1054 16     Temp 09/13/22 1054 98.2 F (36.8 C)     Temp Source 09/13/22 1054 Oral     SpO2 09/13/22 1054 100 %     Weight 09/13/22 1053 165 lb (74.8 kg)     Height --      Head Circumference --      Peak Flow --      Pain Score 09/13/22 1052 6     Pain Loc --      Pain Edu? --      Excl. in [place]? --    No data found.  Updated Vital Signs BP 121/76 (BP Location: Left Arm)   Pulse (!) 56   Temp 98.2 F (36.8 C) (Oral)   Resp 16   Wt 165 lb (74.8 kg)   SpO2 100%   BMI 25.84 kg/m   Visual Acuity Right Eye Distance:   Left Eye Distance:   Bilateral Distance:    Right Eye Near:   Left Eye Near:    Bilateral Near:     Physical Exam Vitals and nursing note reviewed.  Constitutional:      Appearance: Normal appearance. She is not ill-appearing.  HENT:     Head: Normocephalic and atraumatic.     Right Ear: Tympanic membrane, ear canal and external ear normal. There is no impacted cerumen.     Left Ear: Tympanic membrane, ear canal and external ear normal. There is no impacted cerumen.     Nose: Congestion and rhinorrhea present.     Comments: Mucosa is mildly edematous and erythematous with thick yellow discharge in both nares.  She has tenderness to compression of frontal and maxillary sinuses bilaterally.    Mouth/Throat:     Mouth: Mucous membranes are moist.     Pharynx: Oropharynx is clear. Posterior oropharyngeal erythema present. No oropharyngeal exudate.     Comments: Posterior oropharynx demonstrates mild erythema and yellow postnasal drip. Cardiovascular:     Rate and Rhythm: Normal  rate and regular rhythm.     Pulses: Normal pulses.     Heart sounds: Normal heart sounds. No murmur heard.    No friction rub. No gallop.  Pulmonary:     Effort: Pulmonary effort is normal.     Breath sounds: Normal breath sounds. No wheezing, rhonchi or rales.  Musculoskeletal:     Cervical back: Normal range of motion and neck supple.  Lymphadenopathy:     Cervical: No cervical adenopathy.  Skin:    General: Skin is warm and dry.     Capillary Refill: Capillary refill takes less than 2 seconds.     Findings: No erythema or rash.  Neurological:     General: No focal deficit present.     Mental Status: She is alert and oriented to person, place, and time.      UC Treatments / Results  Labs (all labs ordered are listed, but only abnormal results are displayed) Labs Reviewed - No data to display  EKG   Radiology No results found.  Procedures Procedures (including critical care time)  Medications Ordered in UC Medications - No data to display  Initial Impression / Assessment and Plan / UC Course  I have reviewed the triage vital signs and the nursing notes.  Pertinent labs & imaging results that were available during my care of the patient were reviewed by me and considered in my medical decision making (see chart for details).   Patient is a nontoxic-appearing 37 year old female presenting for evaluation of 2 weeks worth of respiratory symptoms as outlined in HPI above.  Her physical exam reveals purulent discharge in both nares along with sinus tenderness to compression of both her frontal and maxillary sinuses bilaterally.  This is consistent with pansinusitis.  I will put her on Augmentin 875 twice daily for 10 days for treatment of pansinusitis.  She also has a longstanding history of constipation and states she is supposed to be on MiraLAX but she is currently out and she is requesting a prescription.  I will refill her MiraLAX for her.  She is status post surgery for  bowel blockage in September 2023.   Final Clinical Impressions(s) / UC Diagnoses   Final diagnoses:  Acute non-recurrent pansinusitis  Constipation, unspecified constipation type     Discharge Instructions      The Augmentin twice daily with food for 10 days for treatment of your sinusitis.  Perform sinus irrigation 2-3 times a day with a [employer] sinus rinse kit and distilled water.  Do not use tap water.  You can use plain over-the-counter Mucinex every 6 hours to break up the stickiness of the mucus so your body can clear it.  Increase your oral fluid intake to thin out your mucus so that is also able for your body to clear more easily.  Take an over-the-counter probiotic, such as Culturelle-align-activia, 1 hour after each dose of antibiotic to prevent diarrhea.  If you develop any new or worsening symptoms return for reevaluation or see your primary care provider.  Take the Miralax daily for control of your constipation.     ED Prescriptions     Medication Sig Dispense Auth. Provider   amoxicillin-clavulanate (AUGMENTIN) 875-125 MG tablet Take 1 tablet by mouth every 12 (twelve) hours for 10 days. 20 tablet Margarette Canada, NP   polyethylene glycol (MIRALAX) 17 g packet Take 17 g by mouth daily. 100 each Margarette Canada, NP      PDMP not reviewed this encounter.   Margarette Canada, NP 09/13/22 1140

## 2022-09-13 NOTE — Discharge Instructions (Signed)
The Augmentin twice daily with food for 10 days for treatment of your sinusitis.  Perform sinus irrigation 2-3 times a day with a NeilMed sinus rinse kit and distilled water.  Do not use tap water.  You can use plain over-the-counter Mucinex every 6 hours to break up the stickiness of the mucus so your body can clear it.  Increase your oral fluid intake to thin out your mucus so that is also able for your body to clear more easily.  Take an over-the-counter probiotic, such as Culturelle-align-activia, 1 hour after each dose of antibiotic to prevent diarrhea.  If you develop any new or worsening symptoms return for reevaluation or see your primary care provider.  Take the Miralax daily for control of your constipation.

## 2022-11-12 ENCOUNTER — Ambulatory Visit
Admission: EM | Admit: 2022-11-12 | Discharge: 2022-11-12 | Disposition: A | Discharge status: Home / Self Care | Payer: Medicaid Other | Attending: Family Medicine | Admitting: Family Medicine

## 2022-11-12 DIAGNOSIS — R5383 Other fatigue: Secondary | ICD-10-CM | POA: Diagnosis not present

## 2022-11-12 DIAGNOSIS — R140 Abdominal distension (gaseous): Secondary | ICD-10-CM

## 2022-11-12 DIAGNOSIS — R6889 Other general symptoms and signs: Secondary | ICD-10-CM

## 2022-11-12 DIAGNOSIS — H5789 Other specified disorders of eye and adnexa: Secondary | ICD-10-CM | POA: Diagnosis present

## 2022-11-12 DIAGNOSIS — R350 Frequency of micturition: Secondary | ICD-10-CM | POA: Insufficient documentation

## 2022-11-12 DIAGNOSIS — N644 Mastodynia: Secondary | ICD-10-CM | POA: Diagnosis present

## 2022-11-12 LAB — CBC WITH DIFFERENTIAL/PLATELET
Abs Immature Granulocytes: 0.02 10*3/uL (ref 0.00–0.07)
Basophils Absolute: 0.1 10*3/uL (ref 0.0–0.1)
Basophils Relative: 1 %
Eosinophils Absolute: 0.3 10*3/uL (ref 0.0–0.5)
Eosinophils Relative: 4 %
HCT: 35.4 % — ABNORMAL LOW (ref 36.0–46.0)
Hemoglobin: 11.4 g/dL — ABNORMAL LOW (ref 12.0–15.0)
Immature Granulocytes: 0 %
Lymphocytes Relative: 23 %
Lymphs Abs: 1.4 10*3/uL (ref 0.7–4.0)
MCH: 27.5 pg (ref 26.0–34.0)
MCHC: 32.2 g/dL (ref 30.0–36.0)
MCV: 85.3 fL (ref 80.0–100.0)
Monocytes Absolute: 0.4 10*3/uL (ref 0.1–1.0)
Monocytes Relative: 7 %
Neutro Abs: 4 10*3/uL (ref 1.7–7.7)
Neutrophils Relative %: 65 %
Platelets: 218 10*3/uL (ref 150–400)
RBC: 4.15 MIL/uL (ref 3.87–5.11)
RDW: 14.4 % (ref 11.5–15.5)
WBC: 6.2 10*3/uL (ref 4.0–10.5)
nRBC: 0 % (ref 0.0–0.2)

## 2022-11-12 LAB — URINALYSIS, W/ REFLEX TO CULTURE (INFECTION SUSPECTED)
Bacteria, UA: NONE SEEN
Bilirubin Urine: NEGATIVE
Glucose, UA: NEGATIVE mg/dL
Hgb urine dipstick: NEGATIVE
Ketones, ur: NEGATIVE mg/dL
Leukocytes,Ua: NEGATIVE
Nitrite: NEGATIVE
Protein, ur: NEGATIVE mg/dL
RBC / HPF: NONE SEEN RBC/hpf (ref 0–5)
Specific Gravity, Urine: 1.02 (ref 1.005–1.030)
WBC, UA: NONE SEEN WBC/hpf (ref 0–5)
pH: 7 (ref 5.0–8.0)

## 2022-11-12 LAB — COMPREHENSIVE METABOLIC PANEL
ALT: 16 U/L (ref 0–44)
AST: 14 U/L — ABNORMAL LOW (ref 15–41)
Albumin: 4.3 g/dL (ref 3.5–5.0)
Alkaline Phosphatase: 41 U/L (ref 38–126)
Anion gap: 4 — ABNORMAL LOW (ref 5–15)
BUN: 10 mg/dL (ref 6–20)
CO2: 27 mmol/L (ref 22–32)
Calcium: 8 mg/dL — ABNORMAL LOW (ref 8.9–10.3)
Chloride: 101 mmol/L (ref 98–111)
Creatinine, Ser: 0.74 mg/dL (ref 0.44–1.00)
GFR, Estimated: >60 mL/min (ref >60)
Glucose, Bld: 88 mg/dL (ref 70–99)
Potassium: 4.2 mmol/L (ref 3.5–5.1)
Sodium: 132 mmol/L — ABNORMAL LOW (ref 135–145)
Total Bilirubin: 1.6 mg/dL — ABNORMAL HIGH (ref 0.3–1.2)
Total Protein: 7.2 g/dL (ref 6.5–8.1)

## 2022-11-12 LAB — PREGNANCY, URINE: Preg Test, Ur: NEGATIVE

## 2022-11-12 LAB — BRAIN NATRIURETIC PEPTIDE: B Natriuretic Peptide: 88.4 pg/mL (ref 0.0–100.0)

## 2022-11-12 LAB — TSH: TSH: 1.662 u[IU]/mL (ref 0.350–4.500)

## 2022-11-12 NOTE — ED Triage Notes (Signed)
Pt c/o eye drainage in right eye. Pt states that she has yellow drainage coming from both corners of her eyes x40months  Pt is worried about a sinus infection.   Pt states that her eye has been blurry but she was evaluated by an eye doctor and was told to use thicker eye drops.   Pt wants to discuss "water weight". Pt states that she had gastro surgery in September and has been holding water in her chest and stomach. Pt states that if she eats she feels like she "would pop".   Pt states that she weighs herself weekly and has gained about 12-15lbs in a week.

## 2022-11-12 NOTE — Discharge Instructions (Addendum)
You do not have a urinary tract infection.  You are slightly anemic.  You did not have signs of infection or inflammation in your blood.  Your sodium/salt level is slightly low.  Be sure to eat some salty food over the next 1 to 2 days.  This will correct itself on its own.  If you start having pain in the right upper part of your abdomen, go to the emergency department for imaging.  You have a few tests that will not be back until tomorrow.  If they are abnormal, we will contact you.  Be sure to follow-up with your primary care provider for further workup.

## 2022-11-12 NOTE — ED Triage Notes (Addendum)
Pt states that she weighed herself a week ago and she was around 160lbs and the are where she had her stitching in her stomach hurts.   Pt states that she is currently not taking any medication.

## 2022-11-12 NOTE — ED Provider Notes (Signed)
[facility]    CSN: [id number] Arrival date & time: 11/12/22  1110      History   Chief Complaint Chief Complaint  Patient presents with   Eye Problem    HPI Jasmin Robinson is a 37 y.o. female.   HPI   Jasmin Robinson presents for "lots of pain."  Patient has multiple concerns today.  She has a rash on her central chest that is improving.  She is unsure what caused it.  No one else has similar symptoms.  She feels like it is underneath her skin.   Feels like "a water bed."  She gained 10 lbs gain in about a week.  Notes that she weighs herself weekly.  Feels horrible. Has history of kidney failure yes she was younger.  Had nephritis.  She does not follow with a nephrologist now.  Denies any chest pain or chest discomfort.  Notes she feels like she is holding water in her chest and abdomen.  She can hear it move around.  Does not feel like her pants are fitting any tighter.  Notes that when she eats she feels like she is going to pop.  She had surgery in September and has going periumbilical abdominal pain.  Says she has stitching in her stomach and that hurts.  She is tired all the time.   She is concerned that she may have a sinus infection.  She has nasal congestion and runny nose.  Denies dental pain or jaw pain.  She has drainage out of her right eye but this is not new.  She was previously given some medication for this but it did not help.  This problem has been ongoing for about 2 months.  The drainage is yellow.  Denies fever, cough, shortness of breath.   She has some pain in both of her breasts.  She has never had a mammogram and and notes that they will never smash in that machine.  No drainage from the nipples.  Says that she is unsure when her last menstrual cycle was.  Had a tubal ligation about 14 years ago then states that she takes Depo and birth control pills.   She has some burning with urination.  Feels like she is urinating more frequently.  Does not have any  vaginal discharge.  No blood in the urine.   Past Medical History:  Diagnosis Date   Anxiety    Fibromyalgia    Migraines     There are no problems to display for this patient.   Past Surgical History:  Procedure Laterality Date   ABDOMINAL SURGERY     CESAREAN SECTION      OB History   No obstetric history on file.      Home Medications    Prior to Admission medications   Medication Sig Start Date End Date Taking? Authorizing Provider  polyethylene glycol (MIRALAX) 17 g packet Take 17 g by mouth daily. 09/13/22  Yes Becky Augusta, NP  albuterol (PROVENTIL HFA;VENTOLIN HFA) 108 (90 Base) MCG/ACT inhaler Inhale into the lungs every 6 (six) hours as needed for wheezing or shortness of breath.    [provider]  alum & mag hydroxide-simeth (MAALOX PLUS) 400-400-40 MG/5ML suspension Take 5 mLs by mouth every 6 (six) hours as needed for indigestion. 07/30/22   White, Elita Boone, NP  busPIRone (BUSPAR) 7.5 MG tablet Take 1 tablet (7.5 mg total) by mouth 2 (two) times daily. 06/22/21   Becky Augusta, NP  cyclobenzaprine (  FLEXERIL) 10 MG tablet Take 1 tablet (10 mg total) by mouth at bedtime. 02/12/21   Domenick Gong, MD  erythromycin ophthalmic ointment Place a 1/2 inch ribbon of ointment into the lower eyelid. Twice a day for 7 days 07/30/22   Valinda Hoar, NP  fluticasone (FLONASE) 50 MCG/ACT nasal spray Place 2 sprays into both nostrils daily. 02/12/21   Domenick Gong, MD  gabapentin (NEURONTIN) 600 MG tablet TAKE 1 TABLET BY MOUTH FOUR TIMES DAILY WITH MEALS 12/03/19   [provider]  hydrocortisone 1 % ointment Apply 1 Application topically 2 (two) times daily. 06/16/22   Domenick Gong, MD  hydrOXYzine (ATARAX) 25 MG tablet Take 1 tablet (25 mg total) by mouth 3 (three) times daily as needed for anxiety. 01/18/22   Minna Antis, MD  miconazole (MICOTIN) 2 % cream Apply 1 Application topically 2 (two) times daily. 06/16/22   Domenick Gong, MD   naproxen (NAPROSYN) 500 MG tablet Take 1 tablet (500 mg total) by mouth 2 (two) times daily with a meal. 05/29/22   Eusebio Friendly B, PA-C  ondansetron (ZOFRAN-ODT) 4 MG disintegrating tablet Take 1 tablet (4 mg total) by mouth every 8 (eight) hours as needed for nausea or vomiting. 05/29/22   Shirlee Latch, PA-C  citalopram (CELEXA) 10 MG tablet Take 10 mg by mouth daily.  01/20/19  [provider]  topiramate (TOPAMAX) 25 MG capsule Take 25 mg by mouth 2 (two) times daily.  01/20/19  [provider]    Family History History reviewed. No pertinent family history.  Social History Social History   Tobacco Use   Smoking status: Former    Types: Cigarettes   Smokeless tobacco: Never  Vaping Use   Vaping Use: Some days  Substance Use Topics   Alcohol use: Yes   Drug use: Never     Allergies   Sulfa antibiotics   Review of Systems Review of Systems: negative unless otherwise stated in HPI.      Physical Exam Triage Vital Signs ED Triage Vitals  Enc Vitals Group     BP 11/12/22 1129 121/76     Pulse Rate 11/12/22 1129 (!) 57     Resp --      Temp 11/12/22 1129 99.1 F (37.3 C)     Temp Source 11/12/22 1129 Oral     SpO2 11/12/22 1129 100 %     Weight 11/12/22 1123 183 lb (83 kg)     Height 11/12/22 1123 5\' 7"  (1.702 m)     Head Circumference --      Peak Flow --      Pain Score 11/12/22 1121 9     Pain Loc --      Pain Edu? --      Excl. in [place]? --    No data found.  Updated Vital Signs BP 121/76 (BP Location: Left Arm)   Pulse (!) 57   Temp 99.1 F (37.3 C) (Oral)   Ht 5\' 7"  (1.702 m)   Wt 83 kg   SpO2 100%   BMI 28.66 kg/m   Visual Acuity Right Eye Distance:   Left Eye Distance:   Bilateral Distance:    Right Eye Near:   Left Eye Near:    Bilateral Near:     Physical Exam GEN:     alert, cooperative well appearing female and no distress    HENT:  mucus membranes moist, oropharyngeal without lesions or erythema,  nares patent,  no nasal  discharge  EYES:   pupils equal and reactive, EOM intact, no scleral injection, no discharge, no chemosis, no subconjunctival hemorrhage, gross vision intact NECK:  supple, normal ROM, no lymphadenopathy  RESP:  clear to auscultation bilaterally, no increased work of breathing  CVS:   regular rate and rhythm, no murmur, distal pulses intact, no rash on chest wall ABD:  soft, non-tender in all quadrants; bowel sounds present; no palpable masses, no rebounding, no guarding EXT:   normal ROM, atraumatic, no wetting or pitting edema NEURO: Fluent speech alert, normal coordination Skin:   warm and dry, no rash, normal skin turgor Psych: Flight of ideas, communicative and cooperative with normal short attention span      UC Treatments / Results  Labs (all labs ordered are listed, but only abnormal results are displayed) Labs Reviewed  CBC WITH DIFFERENTIAL/PLATELET - Abnormal; Notable for the following components:      Result Value   Hemoglobin 11.4 (*)    HCT 35.4 (*)    All other components within normal limits  COMPREHENSIVE METABOLIC PANEL - Abnormal; Notable for the following components:   Sodium 132 (*)    Calcium 8.0 (*)    AST 14 (*)    Total Bilirubin 1.6 (*)    Anion gap 4 (*)    All other components within normal limits  URINALYSIS, W/ REFLEX TO CULTURE (INFECTION SUSPECTED)  PREGNANCY, URINE  TSH  BRAIN NATRIURETIC PEPTIDE    EKG   Radiology No results found.   Procedures Procedures (including critical care time)  Medications Ordered in UC Medications - No data to display  Initial Impression / Assessment and Plan / UC Course  I have reviewed the triage vital signs and the nursing notes.  Pertinent labs & imaging results that were available during my care of the patient were reviewed by me and considered in my medical decision making (see chart for details).       Pt is a 37 y.o. female who presents for multiple somatic complaints.  When asking  patient to write them she is unable to stay on topic.  She speaks to the CMA about relapse taking over.  Saryiah is afebrile here.  Initial vitals.  She was mildly bradycardic but this is not new.  Satting well on room air.  She does have a mildly elevated temperature of 99.1 F.    Overall pt is well appearing, well hydrated, without respiratory distress. Exam is unremarkable.  Discussed obtaining lab work and she is agreeable.  Ordered UA, urine pregnancy, CBC CMP, TSH and BNP.    Urinalysis not concerning for acute cystitis.  There is no hematuria.  She is not pregnant.   Her normocytic anemia appears to be chronic and is stable and near her baseline.  She has a mild hyponatremia, sodium 132 and a mild hypocalcemia, calcium 8.0.  Her bilirubin is elevated at 1.6 however remaining LFTs are grossly unremarkable.  She does not have a leukocytosis to suggest an acute infection or inflammatory process.  TSH is normal.  BNP less than 100 therefore doubt acute volume overload.  Reviewed results with patient and she is still concerned mostly about her weight gain.  Expressed that her testing today did not reveal the cause of this. Explained we have evaluated for pregnancy, thyroid issues, acute volume overload, liver and kidney dysfunction.  On chart review, she was around 180 pounds in September 2021, in March 2024 she was 165 pounds and now she is 43  pounds. Recommended that she follow-up with her primary care provider to continue her ongoing workup.    She has no evidence of acute conjunctivitis.  Recommended she take allergy medicine for her ongoing eye concerns as she has tried antibiotics in this 80-month timeframe which did not help.  Breast workup deferred.  Patient to follow-up with her primary care provider.  Return and ED precautions given and voiced understanding. Discussed MDM, treatment plan and plan for follow-up with patient who agrees with plan.     Final Clinical Impressions(s) / UC  Diagnoses   Final diagnoses:  Fatigue, unspecified type  Abdominal bloating  Breast pain  Eye drainage  Urinary frequency     Discharge Instructions      You do not have a urinary tract infection.  You are slightly anemic.  You did not have signs of infection or inflammation in your blood.  Your sodium/salt level is slightly low.  Be sure to eat some salty food over the next 1 to 2 days.  This will correct itself on its own.  If you start having pain in the right upper part of your abdomen, go to the emergency department for imaging.  You have a few tests that will not be back until tomorrow.  If they are abnormal, we will contact you.  Be sure to follow-up with your primary care provider for further workup.     ED Prescriptions   None    PDMP not reviewed this encounter.   Katha Cabal, DO 11/19/22 1610

## 2022-12-31 DIAGNOSIS — R61 Generalized hyperhidrosis: Secondary | ICD-10-CM | POA: Insufficient documentation

## 2022-12-31 DIAGNOSIS — M2550 Pain in unspecified joint: Secondary | ICD-10-CM | POA: Insufficient documentation

## 2023-01-20 DIAGNOSIS — K625 Hemorrhage of anus and rectum: Secondary | ICD-10-CM | POA: Insufficient documentation

## 2023-01-20 DIAGNOSIS — D500 Iron deficiency anemia secondary to blood loss (chronic): Secondary | ICD-10-CM | POA: Insufficient documentation

## 2023-02-03 DIAGNOSIS — M064 Inflammatory polyarthropathy: Secondary | ICD-10-CM | POA: Insufficient documentation

## 2023-03-15 ENCOUNTER — Ambulatory Visit
Admission: EM | Admit: 2023-03-15 | Discharge: 2023-03-15 | Payer: Medicaid Other | Attending: Physician Assistant | Admitting: Physician Assistant

## 2023-03-15 DIAGNOSIS — R109 Unspecified abdominal pain: Secondary | ICD-10-CM

## 2023-03-15 DIAGNOSIS — M25511 Pain in right shoulder: Secondary | ICD-10-CM | POA: Diagnosis not present

## 2023-03-15 DIAGNOSIS — M797 Fibromyalgia: Secondary | ICD-10-CM | POA: Diagnosis not present

## 2023-03-15 DIAGNOSIS — R1011 Right upper quadrant pain: Secondary | ICD-10-CM | POA: Diagnosis present

## 2023-03-15 DIAGNOSIS — R100 Acute abdomen: Secondary | ICD-10-CM | POA: Insufficient documentation

## 2023-03-15 LAB — CBC WITH DIFFERENTIAL/PLATELET
Abs Immature Granulocytes: 0.02 10*3/uL (ref 0.00–0.07)
Basophils Absolute: 0.1 10*3/uL (ref 0.0–0.1)
Basophils Relative: 1 %
Eosinophils Absolute: 0.4 10*3/uL (ref 0.0–0.5)
Eosinophils Relative: 5 %
HCT: 37.6 % (ref 36.0–46.0)
Hemoglobin: 12.3 g/dL (ref 12.0–15.0)
Immature Granulocytes: 0 %
Lymphocytes Relative: 19 %
Lymphs Abs: 1.7 10*3/uL (ref 0.7–4.0)
MCH: 27.6 pg (ref 26.0–34.0)
MCHC: 32.7 g/dL (ref 30.0–36.0)
MCV: 84.5 fL (ref 80.0–100.0)
Monocytes Absolute: 0.5 10*3/uL (ref 0.1–1.0)
Monocytes Relative: 6 %
Neutro Abs: 6.2 10*3/uL (ref 1.7–7.7)
Neutrophils Relative %: 69 %
Platelets: 249 10*3/uL (ref 150–400)
RBC: 4.45 MIL/uL (ref 3.87–5.11)
RDW: 14.4 % (ref 11.5–15.5)
WBC: 9 10*3/uL (ref 4.0–10.5)
nRBC: 0 % (ref 0.0–0.2)

## 2023-03-15 LAB — URINALYSIS, ROUTINE W REFLEX MICROSCOPIC
Bilirubin Urine: NEGATIVE
Glucose, UA: NEGATIVE mg/dL
Hgb urine dipstick: NEGATIVE
Leukocytes,Ua: NEGATIVE
Nitrite: NEGATIVE
Protein, ur: NEGATIVE mg/dL
Specific Gravity, Urine: 1.025 (ref 1.005–1.030)
pH: 6.5 (ref 5.0–8.0)

## 2023-03-15 LAB — COMPREHENSIVE METABOLIC PANEL
ALT: 16 U/L (ref 0–44)
AST: 16 U/L (ref 15–41)
Albumin: 4.3 g/dL (ref 3.5–5.0)
Alkaline Phosphatase: 45 U/L (ref 38–126)
Anion gap: 7 (ref 5–15)
BUN: 13 mg/dL (ref 6–20)
CO2: 21 mmol/L — ABNORMAL LOW (ref 22–32)
Calcium: 9 mg/dL (ref 8.9–10.3)
Chloride: 107 mmol/L (ref 98–111)
Creatinine, Ser: 0.95 mg/dL (ref 0.44–1.00)
GFR, Estimated: >60 mL/min (ref >60)
Glucose, Bld: 108 mg/dL — ABNORMAL HIGH (ref 70–99)
Potassium: 3.8 mmol/L (ref 3.5–5.1)
Sodium: 135 mmol/L (ref 135–145)
Total Bilirubin: 0.5 mg/dL (ref 0.3–1.2)
Total Protein: 7.6 g/dL (ref 6.5–8.1)

## 2023-03-15 LAB — PREGNANCY, URINE: Preg Test, Ur: NEGATIVE

## 2023-03-15 LAB — LIPASE, BLOOD: Lipase: 23 U/L (ref 11–51)

## 2023-03-15 MED ORDER — ALUM & MAG HYDROXIDE-SIMETH 200-200-20 MG/5ML PO SUSP
30.0000 mL | Freq: Once | ORAL | Status: AC
  Administered 2023-03-15: 30 mL via ORAL

## 2023-03-15 NOTE — ED Triage Notes (Signed)
Pt c/o R flank pain x1 wk. Was seen at [facility] ED on 8/27 for the same issues but LWBS after triage due to long wait time. States having pain in low abd pain with pressure radiating up to epigastric region. States that she has difficulty taking a deep breath due to abd pressure. Hx of fibromyalgia.

## 2023-03-15 NOTE — ED Notes (Signed)
Patient is being discharged from the Urgent Care and sent to the Emergency Department via POV . Per Dorann Ou PA, patient is in need of higher level of care due to severe abdominal pain. Patient is aware and verbalizes understanding of plan of care.  Vitals:   03/15/23 1302  BP: 125/84  Pulse: 86  Resp: 16  Temp: 98.9 F (37.2 C)  SpO2: 97%

## 2023-03-15 NOTE — ED Provider Notes (Signed)
[facility]    CSN: [id number] Arrival date & time: 03/15/23  1247      History   Chief Complaint Chief Complaint  Patient presents with   Flank Pain    HPI Jasmin Robinson is a 37 y.o. female.   Patient presents today with a prolonged history of abdominal pain that has worsened in the past 2 weeks.  She reports the pain is localized to her right lateral abdomen with radiation into her right shoulder.  She is followed by [facility] and has undergone significant testing including endoscopy a few months ago.  At that time she did have a small mucosal erosion but was negative for H. pylori.  She was seen in the emergency room for similar symptoms 03/08/2023 at which point blood work was normal but she did not stay to be evaluated by provider and no imaging was obtained.  She has had abdominal surgery but denies any previous appendectomy or cholecystectomy.  She reports early satiety and has had a decrease in oral intake.  She has difficulty with constipation but has been passing small bowel movements with last occurrence earlier today.  She denies any unusual food, alcohol use, NSAID use.  She does have a history of fibromyalgia and has chronic pain but reports this is different than her baseline pain.    Past Medical History:  Diagnosis Date   Anxiety    Fibromyalgia    Migraines     There are no problems to display for this patient.   Past Surgical History:  Procedure Laterality Date   ABDOMINAL SURGERY     CESAREAN SECTION      OB History   No obstetric history on file.      Home Medications    Prior to Admission medications   Medication Sig Start Date End Date Taking? Authorizing Provider  albuterol (PROVENTIL HFA;VENTOLIN HFA) 108 (90 Base) MCG/ACT inhaler Inhale into the lungs every 6 (six) hours as needed for wheezing or shortness of breath.   Yes [provider]  busPIRone (BUSPAR) 7.5 MG tablet Take 1 tablet (7.5 mg total) by mouth 2 (two) times  daily. 06/22/21  Yes Becky Augusta, NP  Cholecalciferol (VITAMIN D3) 10 MCG (400 UNIT) tablet Take 800 Units by mouth daily. 02/10/23  Yes [provider]  DULoxetine (CYMBALTA) 20 MG capsule Take by mouth.   Yes [provider]  fluticasone (FLONASE) 50 MCG/ACT nasal spray Place 2 sprays into both nostrils daily. 02/12/21  Yes Domenick Gong, MD  gabapentin (NEURONTIN) 600 MG tablet TAKE 1 TABLET BY MOUTH FOUR TIMES DAILY WITH MEALS 12/03/19  Yes [provider]  hydrOXYzine (ATARAX) 25 MG tablet Take 1 tablet (25 mg total) by mouth 3 (three) times daily as needed for anxiety. 01/18/22  Yes Minna Antis, MD  medroxyPROGESTERone (DEPO-PROVERA) 150 MG/ML injection Inject into the muscle.   Yes [provider]  omeprazole (PRILOSEC) 20 MG capsule Take 20 mg by mouth daily. 01/27/23  Yes [provider]  tiZANidine (ZANAFLEX) 4 MG tablet Take 4 mg by mouth 2 (two) times daily as needed.   Yes [provider]  citalopram (CELEXA) 10 MG tablet Take 10 mg by mouth daily.  01/20/19  [provider]  topiramate (TOPAMAX) 25 MG capsule Take 25 mg by mouth 2 (two) times daily.  01/20/19  [provider]    Family History History reviewed. No pertinent family history.  Social History Social History   Tobacco Use  Smoking status: Former    Types: Cigarettes   Smokeless tobacco: Never  Vaping Use   Vaping status: Some Days  Substance Use Topics   Alcohol use: Yes   Drug use: Never     Allergies   Sulfa antibiotics   Review of Systems Review of Systems  Constitutional:  Positive for activity change and appetite change. Negative for fatigue and fever.  Respiratory:  Positive for shortness of breath. Negative for cough.   Cardiovascular:  Negative for chest pain.  Gastrointestinal:  Positive for abdominal pain, constipation (chronic) and nausea. Negative for blood in stool, diarrhea and vomiting.  Genitourinary:   Positive for flank pain. Negative for dysuria, urgency, vaginal bleeding, vaginal discharge and vaginal pain.  Neurological:  Negative for dizziness, light-headedness and headaches.     Physical Exam Triage Vital Signs ED Triage Vitals  Encounter Vitals Group     BP 03/15/23 1302 125/84     Systolic BP Percentile --      Diastolic BP Percentile --      Pulse Rate 03/15/23 1302 86     Resp 03/15/23 1302 16     Temp 03/15/23 1302 98.9 F (37.2 C)     Temp Source 03/15/23 1302 Oral     SpO2 03/15/23 1302 97 %     Weight 03/15/23 1300 198 lb (89.8 kg)     Height 03/15/23 1300 5\' 7"  (1.702 m)     Head Circumference --      Peak Flow --      Pain Score 03/15/23 1307 8     Pain Loc --      Pain Education --      Exclude from Growth Chart --    No data found.  Updated Vital Signs BP 125/84 (BP Location: Left Arm)   Pulse 86   Temp 98.9 F (37.2 C) (Oral)   Resp 16   Ht 5\' 7"  (1.702 m)   Wt 198 lb (89.8 kg)   SpO2 97%   BMI 31.01 kg/m   Visual Acuity Right Eye Distance:   Left Eye Distance:   Bilateral Distance:    Right Eye Near:   Left Eye Near:    Bilateral Near:     Physical Exam Vitals reviewed.  Constitutional:      General: She is awake. She is not in acute distress.    Appearance: Normal appearance. She is well-developed. She is ill-appearing.     Comments: Very pleasant female appears stated age holding her abdomen   HENT:     Head: Normocephalic and atraumatic.     Mouth/Throat:     Mouth: Mucous membranes are moist.     Pharynx: Uvula midline. No oropharyngeal exudate or posterior oropharyngeal erythema.  Cardiovascular:     Rate and Rhythm: Normal rate and regular rhythm.     Heart sounds: Normal heart sounds, S1 normal and S2 normal. No murmur heard. Pulmonary:     Effort: Pulmonary effort is normal.     Breath sounds: Normal breath sounds. No wheezing, rhonchi or rales.     Comments: Clear to auscultation bilaterally Abdominal:     General:  Bowel sounds are normal.     Palpations: Abdomen is soft.     Tenderness: There is abdominal tenderness in the right upper quadrant. There is no right CVA tenderness, left CVA tenderness, guarding or rebound. Negative signs include Murphy's sign, Rovsing's sign and McBurney's sign.       Comments: Tenderness to palpation  over right abdomen.  Negative Murphy sign.  No evidence of acute abdomen on physical exam.  No CVA tenderness.  Musculoskeletal:     Cervical back: No tenderness or bony tenderness.     Thoracic back: No tenderness or bony tenderness.     Lumbar back: No tenderness or bony tenderness.     Comments: Back: No pain percussion of vertebrae.  No deformity or step-off noted.  No significant tenderness palpation over paraspinal muscles.  Psychiatric:        Behavior: Behavior is cooperative.      UC Treatments / Results  Labs (all labs ordered are listed, but only abnormal results are displayed) Labs Reviewed  URINALYSIS, ROUTINE W REFLEX MICROSCOPIC - Abnormal; Notable for the following components:      Result Value   Ketones, ur TRACE (*)    All other components within normal limits  COMPREHENSIVE METABOLIC PANEL - Abnormal; Notable for the following components:   CO2 21 (*)    Glucose, Bld 108 (*)    All other components within normal limits  PREGNANCY, URINE  LIPASE, BLOOD  CBC WITH DIFFERENTIAL/PLATELET    EKG   Radiology No results found.  Procedures Procedures (including critical care time)  Medications Ordered in UC Medications  alum & mag hydroxide-simeth (MAALOX/MYLANTA) 200-200-20 MG/5ML suspension 30 mL (30 mLs Oral Given 03/15/23 1338)    Initial Impression / Assessment and Plan / UC Course  I have reviewed the triage vital signs and the nursing notes.  Pertinent labs & imaging results that were available during my care of the patient were reviewed by me and considered in my medical decision making (see chart for details).     Patient is  afebrile and nontoxic but has ongoing continuous abdominal pain.  Unfortunately, we do not have any imaging capabilities in urgent care.  Her urine was normal with only trace ketones and she was negative for pregnancy.  Basic labs including CBC, CMP, lipase were essentially normal.  She was given Maalox this actually worsened her symptoms.  We discussed that given her severe ongoing abdominal pain I recommend she go to the emergency room for further evaluation and management including imaging since we do not have these capabilities in urgent care.  She is agreeable and will go directly to ER.  She was stable at the time of discharge and safe for private transport.  Final Clinical Impressions(s) / UC Diagnoses   Final diagnoses:  Continuous severe abdominal pain   Discharge Instructions   None    ED Prescriptions   None    PDMP not reviewed this encounter.   Jeani Hawking, PA-C 03/15/23 1432

## 2023-04-05 ENCOUNTER — Ambulatory Visit
Admission: RE | Admit: 2023-04-05 | Discharge: 2023-04-05 | Disposition: A | Discharge status: Home / Self Care | Payer: Medicaid Other | Source: Ambulatory Visit | Attending: Physician Assistant | Admitting: Physician Assistant

## 2023-04-05 VITALS — BP 123/89 | HR 83 | Temp 98.7°F | Ht 67.0 in | Wt 198.0 lb

## 2023-04-05 DIAGNOSIS — B882 Other arthropod infestations: Secondary | ICD-10-CM

## 2023-04-05 DIAGNOSIS — K623 Rectal prolapse: Secondary | ICD-10-CM

## 2023-04-05 DIAGNOSIS — R5383 Other fatigue: Secondary | ICD-10-CM | POA: Diagnosis not present

## 2023-04-05 NOTE — Discharge Instructions (Addendum)
Specialty Diagnoses / Procedures Referred By Contact Referred To Contact  Rheumatology Diagnoses  Right upper quadrant abdominal pain   Clovis Fredrickson, MD  [address]  [place], [place] [address]  Phone: [phone]  Fax: [phone]  [facility]  [address]  [facility] [address]  [place], [place] [address]  Phone: [phone]  Fax: [phone]    -We are re-testing you for tick disease and will call with results. -Please contact the office above. That is where you were referred to for more workup. Also make an appointment with PCP. -Please follow up with gynecology for pessary removal/pap smear -Go to ER if any symptoms worsen

## 2023-04-05 NOTE — ED Triage Notes (Addendum)
Pt presents to UC c/o weakness, "fuzzy feeling, fevers, chills. Pt states she tested positive for tick bite after having blood work done.

## 2023-04-05 NOTE — ED Provider Notes (Signed)
[facility]    CSN: [id number] Arrival date & time: 04/05/23  1309      History   Chief Complaint No chief complaint on file.   HPI Jasmin Robinson is a 37 y.o. female presenting with multiple complaints.   Patient reports "months" of feeling feverish with fatigue and chills. Also reports feeling "fuzzy" with no energy and generalized weakness. Seen at [facility] ED 3 weeks ago and had extensive workup to include; CXR, CT abdomen/pelvis with and without contrast, RUQ [place], CBC, CMP, Lyme disease serology, erlichia antibody test, Rickettsia panel, UA, pregnancy test, lactate labs, troponin, EKG, and TSH/T3/Free T4.  States she was positive for tick disease and given doxycycline x 10 days, completed medicine a couple of days ago and does not feel any better.  Started medication on 03/23/2023.  Advise follow-up with PCP in 2 to 3 weeks to repeat Ehrlichia testing and follow-up with GI, rheumatology and PCP. Patient has PCP through by [facility] and denies any recent appointments.  Additionally she reports that she would like her pessary taken out and needs a Pap smear.  She sees urogynecology in [place] would like to see someone closer.  Medical history significant for fibromyalgia, anxiety, migraines, and vaginal/rectal prolapse.  HPI  Past Medical History:  Diagnosis Date   Anxiety    Fibromyalgia    Migraines     There are no problems to display for this patient.   Past Surgical History:  Procedure Laterality Date   ABDOMINAL SURGERY     CESAREAN SECTION      OB History   No obstetric history on file.      Home Medications    Prior to Admission medications   Medication Sig Start Date End Date Taking? Authorizing Provider  albuterol (PROVENTIL HFA;VENTOLIN HFA) 108 (90 Base) MCG/ACT inhaler Inhale into the lungs every 6 (six) hours as needed for wheezing or shortness of breath.    [provider]  busPIRone (BUSPAR) 7.5 MG tablet Take 1 tablet (7.5 mg  total) by mouth 2 (two) times daily. 06/22/21   Becky Augusta, NP  Cholecalciferol (VITAMIN D3) 10 MCG (400 UNIT) tablet Take 800 Units by mouth daily. 02/10/23   [provider]  DULoxetine (CYMBALTA) 20 MG capsule Take by mouth.    [provider]  fluticasone (FLONASE) 50 MCG/ACT nasal spray Place 2 sprays into both nostrils daily. 02/12/21   Domenick Gong, MD  gabapentin (NEURONTIN) 600 MG tablet TAKE 1 TABLET BY MOUTH FOUR TIMES DAILY WITH MEALS 12/03/19   [provider]  hydrOXYzine (ATARAX) 25 MG tablet Take 1 tablet (25 mg total) by mouth 3 (three) times daily as needed for anxiety. 01/18/22   Minna Antis, MD  medroxyPROGESTERone (DEPO-PROVERA) 150 MG/ML injection Inject into the muscle.    [provider]  omeprazole (PRILOSEC) 20 MG capsule Take 20 mg by mouth daily. 01/27/23   [provider]  tiZANidine (ZANAFLEX) 4 MG tablet Take 4 mg by mouth 2 (two) times daily as needed.    [provider]  citalopram (CELEXA) 10 MG tablet Take 10 mg by mouth daily.  01/20/19  [provider]  topiramate (TOPAMAX) 25 MG capsule Take 25 mg by mouth 2 (two) times daily.  01/20/19  [provider]    Family History No family history on file.  Social History Social History   Tobacco Use   Smoking status: Former    Types: Cigarettes   Smokeless tobacco: Never  Advertising account planner  Vaping status: Some Days  Substance Use Topics   Alcohol use: Yes   Drug use: Never     Allergies   Sulfa antibiotics   Review of Systems Review of Systems  Constitutional:  Positive for chills and fatigue. Negative for fever.  HENT:  Negative for congestion.   Respiratory:  Negative for cough and shortness of breath.   Cardiovascular:  Negative for chest pain.  Gastrointestinal:  Negative for abdominal pain, diarrhea, nausea and vomiting.  Genitourinary:  Negative for difficulty urinating, dysuria, flank pain and vaginal discharge.   Musculoskeletal:  Positive for myalgias. Negative for gait problem.  Neurological:  Negative for dizziness, weakness and headaches.  Psychiatric/Behavioral:  Positive for agitation. The patient is nervous/anxious.      Physical Exam Triage Vital Signs ED Triage Vitals  Encounter Vitals Group     BP 04/05/23 1333 123/89     Systolic BP Percentile --      Diastolic BP Percentile --      Pulse Rate 04/05/23 1333 83     Resp --      Temp 04/05/23 1333 98.7 F (37.1 C)     Temp Source 04/05/23 1333 Oral     SpO2 04/05/23 1333 98 %     Weight 04/05/23 1329 198 lb (89.8 kg)     Height 04/05/23 1329 5\' 7"  (1.702 m)     Head Circumference --      Peak Flow --      Pain Score --      Pain Loc --      Pain Education --      Exclude from Growth Chart --    No data found.  Updated Vital Signs BP 123/89 (BP Location: Left Arm)   Pulse 83   Temp 98.7 F (37.1 C) (Oral)   Ht 5\' 7"  (1.702 m)   Wt 198 lb (89.8 kg)   SpO2 98%   BMI 31.01 kg/m       Physical Exam Vitals and nursing note reviewed.  Constitutional:      General: She is not in acute distress.    Appearance: Normal appearance. She is not ill-appearing or toxic-appearing.  HENT:     Head: Normocephalic and atraumatic.  Eyes:     General: No scleral icterus.       Right eye: No discharge.        Left eye: No discharge.     Conjunctiva/sclera: Conjunctivae normal.  Cardiovascular:     Rate and Rhythm: Normal rate and regular rhythm.     Heart sounds: Normal heart sounds.  Pulmonary:     Effort: Pulmonary effort is normal. No respiratory distress.     Breath sounds: Normal breath sounds.  Musculoskeletal:     Cervical back: Neck supple.  Skin:    General: Skin is dry.  Neurological:     General: No focal deficit present.     Mental Status: She is alert. Mental status is at baseline.     Motor: No weakness.     Gait: Gait normal.  Psychiatric:        Mood and Affect: Mood is anxious and depressed.       UC Treatments / Results  Labs (all labs ordered are listed, but only abnormal results are displayed) Labs Reviewed  EHRLICHIA ANTIBODY PANEL    Component Ref Range & Units 03/16/23 0043  Ehrlichia Acute IFA Nonreactive Reactive - Abnormal  Ehrlichia Screen Titer <1:128 >=1:128 High  Resulting Agency  [facility]    CBC w/ Differential (03/15/2023 5:55 PM EDT) Lab Results - CBC w/ Differential (03/15/2023 5:55 PM EDT) Component Value Ref Range Test Method Analysis Time Performed At Pathologist Signature  WBC 9.4 3.6 - 11.2 10*9/L   03/15/2023 6:12 PM EDT [facility]    RBC 4.47 3.95 - 5.13 10*12/L   03/15/2023 6:12 PM EDT [facility]    HGB 12.1 11.3 - 14.9 g/dL   40/98/1191 4:78 PM EDT [facility]    HCT 36.9 34.0 - 44.0 %   03/15/2023 6:12 PM EDT [facility]    MCV 82.6 77.6 - 95.7 fL   03/15/2023 6:12 PM EDT [facility]    MCH 27.1 25.9 - 32.4 pg   03/15/2023 6:12 PM EDT [facility]    MCHC 32.8 32.0 - 36.0 g/dL   29/56/2130 8:65 PM EDT [facility]    RDW 14.3 12.2 - 15.2 %   03/15/2023 6:12 PM EDT [facility]    MPV 8.1 6.8 - 10.7 fL   03/15/2023 6:12 PM EDT [facility]    Platelet 251 150 - 450 10*9/L   03/15/2023 6:12 PM EDT [facility]    Neutrophils % 69.6 %   03/15/2023 6:12 PM EDT [facility]    Lymphocytes % 17.4 %   03/15/2023 6:12 PM EDT [facility]    Monocytes % 6.9 %   03/15/2023 6:12 PM EDT [facility]    Eosinophils % 5.1 %   03/15/2023 6:12 PM EDT [facility]    Basophils % 1.0 %   03/15/2023 6:12 PM EDT [facility]    Absolute Neutrophils 6.6 1.8 - 7.8 10*9/L   03/15/2023 6:12 PM EDT [facility]    Absolute Lymphocytes 1.6 1.1 - 3.6 10*9/L   03/15/2023 6:12 PM EDT [facility]    Absolute Monocytes 0.7 0.3 - 0.8 10*9/L   03/15/2023 6:12 PM EDT [facility]    Absolute Eosinophils 0.5 0.0 - 0.5 10*9/L   03/15/2023 6:12 PM EDT [facility]    Absolute Basophils 0.1 0.0 - 0.1 10*9/L   03/15/2023 6:12 PM EDT [facility]     Lab Results - CBC w/ Differential (03/15/2023 5:55 PM EDT) Specimen (Source) Anatomical Location / Laterality Collection Method / Volume Collection Time Received Time  Blood   Venipuncture / Unknown 03/15/2023 5:55 PM EDT 03/15/2023 6:01 PM EDT   Lab Results - CBC w/ Differential (03/15/2023 5:55 PM EDT) Narrative   (ABNORMAL) Comprehensive Metabolic Panel (03/15/2023 5:55 PM EDT) Lab Results - (ABNORMAL) Comprehensive Metabolic Panel (03/15/2023 5:55 PM EDT) Component Value Ref Range Test Method Analysis Time Performed At Pathologist Signature  Sodium 140 135 - 145 mmol/L   03/15/2023 6:45 PM EDT [facility]    Potassium 4.1 3.4 - 4.8 mmol/L   03/15/2023 6:45 PM EDT [facility]    Chloride 111 (H) 98 - 107 mmol/L   03/15/2023 6:45 PM EDT [facility]    CO2 23.0 20.0 - 31.0 mmol/L   03/15/2023 6:45 PM EDT [facility]    Anion Gap 6 5 - 14 mmol/L   03/15/2023 6:45 PM EDT [facility]    BUN 10 9 - 23  mg/dL   09/81/1914 7:82 PM EDT [facility]    Creatinine 0.93 0.55 - 1.02 mg/dL   95/62/1308 6:57 PM EDT [facility]    BUN/Creatinine Ratio 11     03/15/2023 6:45 PM EDT [facility]    eGFR CKD-EPI (2021) Female 80 >=60 mL/min/1.14m2   03/15/2023 6:45 PM EDT [facility]    Comment: eGFR calculated with CKD-EPI 2021 equation in accordance with [employer] and [employer] of Nephrology Task Force recommendations.  Glucose 84 70 - 179 mg/dL   84/69/6295 2:84 PM EDT [facility]    Calcium 9.4 8.7 - 10.4 mg/dL   13/24/4010 2:72 PM EDT [facility]    Albumin 4.4 3.4 - 5.0 g/dL   53/66/4403 4:74 PM EDT [facility]    Total Protein 7.3 5.7 - 8.2 g/dL   25/95/6387 5:64 PM EDT [facility]    Total Bilirubin 0.7 0.3 - 1.2 mg/dL   33/29/5188 4:16 PM EDT [facility]    AST 17 <=34 U/L   03/15/2023 6:45 PM EDT [facility]    ALT 13 10 - 49 U/L   03/15/2023 6:45 PM EDT [facility]    Alkaline Phosphatase 60 46 - 116 U/L   03/15/2023 6:45 PM EDT [facility]     Lab Results - (ABNORMAL) Comprehensive Metabolic Panel (03/15/2023 5:55 PM EDT) Specimen (Source) Anatomical Location / Laterality Collection Method / Volume Collection Time Received Time  Blood   Venipuncture / Unknown 03/15/2023 5:55 PM EDT 03/15/2023 6:01 PM EDT   Lab Results - (ABNORMAL) Comprehensive Metabolic Panel (03/15/2023 5:55 PM EDT) Narrative      EKG   Radiology No results found.  Imaging Results - XR Chest 1 view Portable (03/15/2023 9:17 PM EDT) Narrative  03/15/2023 9:21 PM EDT  EXAM: XR CHEST PORTABLE ACCESSION: [id number] Bethann Humble  CLINICAL INDICATION: SHORTNESS OF BREATH    TECHNIQUE: Single View AP Chest Radiograph.  COMPARISON: 09/24/2020  FINDINGS:  Lungs are clear.  No pleural effusion or pneumothorax.  Normal heart size and mediastinal contours.        Imaging Results - CT Abdomen Pelvis W IV Contrast Only (03/15/2023 10:32 PM EDT) Procedure Note  Neomia Dear, MD - 03/16/2023  Formatting of this note might be different from the original. EXAM: CT ABDOMEN PELVIS W CONTRAST ACCESSION: [id number] UN CLINICAL INDICATION: 37 years old with  ruq, right flank pain, chills  COMPARISON: CT abdomen and pelvis 05/03/2020  TECHNIQUE: A helical CT scan of the abdomen and pelvis was obtained following IV contrast from the lung bases through the pubic symphysis. Images were reconstructed in the axial plane. Coronal and sagittal reformatted images were also provided for further evaluation.   FINDINGS:  LOWER CHEST: Unremarkable.  LIVER: Normal liver contour. No focal liver lesions.  BILIARY: Decompressed gallbladder, limiting evaluation. No biliary ductal dilatation.  SPLEEN: Normal in size and contour.  PANCREAS: Normal pancreatic contour. No focal lesions. No ductal dilation.  ADRENAL GLANDS: Normal appearance of the adrenal glands.  KIDNEYS/URETERS: Symmetric renal enhancement. No hydronephrosis. Unchanged small left renal cyst.  BLADDER: Underdistended, limiting evaluation  REPRODUCTIVE ORGANS: Anteverted fibroid uterus. Pessary in place. No suspicious adnexal lesions  GI TRACT: No findings of bowel obstruction or acute inflammation. Moderate colonic stool burden. The appendix is located in the right pelvis and is unremarkable.  PERITONEUM, RETROPERITONEUM AND  MESENTERY: No free air. No ascites. No fluid collection.  LYMPH NODES: No adenopathy.  VESSELS: Hepatic and portal veins are patent. Normal caliber aorta.  BONES and SOFT TISSUES: No aggressive osseous lesions. No focal soft tissue lesions.   IMPRESSION: No focal acute inflammatory process identified in the abdomen or pelvis.   Imaging Results - [place] RUQ W Gallbladder (03/15/2023 9:18 PM EDT) Narrative  03/16/2023 12:14 AM EDT  EXAM: [place] RUQ W GALLBLADDER ACCESSION: [id number] UN  CLINICAL INDICATION: 37 years old with RUQ pain    COMPARISON: 05/11/2020 CT abdomen pelvis  TECHNIQUE: Static and cine images of the right upper quadrant were performed.  FINDINGS:  LIVER: Normal echogenicity. No focal hepatic lesions. No biliary ductal dilatation.      Liver: 18.0 cm     Common bile duct: 0.16 cm  GALLBLADDER: The gallbladder was mildly contracted without internal stones or sludge. Sonographic Murphy sign was negative.  No pericholecystic fluid. No gallbladder wall thickening.     Gallbladder wall: 0.20 cm  LIMITED RIGHT KIDNEY: No hydronephrosis.     Procedures Procedures (including critical care time)  Medications Ordered in UC Medications - No data to display  Initial Impression / Assessment and Plan / UC Course  I have reviewed the triage vital signs and the nursing notes.  Pertinent labs & imaging results that were available during my care of the patient were reviewed by me and considered in my medical decision making (see chart for details).   37 year old female with history of migraines, anxiety, fibromyalgia and rectal/vaginal prolapse presents for several month history of fatigue, chills and feeling feverish with no energy.  Seen at emergency department and had significant and extensive workup 3 weeks ago.  Was referred to rheumatology and also advised to follow-up with PCP and GI.  Has a GI appointment in November.  Patient is positive for ehrlichia and given 10-day course of doxycycline which she completed 3 days ago.  Reports feeling no better or worse since taking the medication.  Vitals are all normal and stable and patient is overall well-appearing.  Appears to be a little anxious and depressed mood.  Reviewed all labs, CT imaging, ultrasound results performed by [facility].  Included some of the findings in this chart.  No significant findings other than positive Ehrlichia testing.  Offered repeat Ehrlichia serology.  Will wait for these results before prescribing more doxycycline.  Discussed the importance of following up with a specialist.  She does not have an appointment with rheumatology yet.  I included the information in her handout to contact them for an appointment.  Also advised making appointment PCP.  ED has suggested  that she follow-up with specialist for more workup for her symptoms.  Placed referral to gynecology for evaluation of vaginal prolapse/vaginal prolapse, pessary removal, Pap smear.    Final Clinical Impressions(s) / UC Diagnoses   Final diagnoses:  Other fatigue  Tick-borne disease  Rectal prolapse     Discharge Instructions       Specialty Diagnoses / Procedures Referred By Contact Referred To Contact  Rheumatology Diagnoses  Right upper quadrant abdominal pain   Clovis Fredrickson, MD  [address]  [place], [place] [address]  Phone: [phone]  Fax: [phone]  [facility] [employer]  [address]  [facility] [address]  [place], [place] [address]  Phone: [phone]  Fax: [phone]    -We are re-testing you for tick disease and will call with results. -Please contact the  office above. That is where you were referred to for more workup. Also make an appointment with PCP. -Please follow up with gynecology for pessary removal/pap smear -Go to ER if any symptoms worsen     ED Prescriptions   None    I have reviewed the PDMP during this encounter.   Shirlee Latch, PA-C 04/05/23 [phone]

## 2023-04-25 ENCOUNTER — Ambulatory Visit
Admission: EM | Admit: 2023-04-25 | Discharge: 2023-04-25 | Disposition: A | Discharge status: Home / Self Care | Payer: Medicaid Other | Attending: Family Medicine | Admitting: Family Medicine

## 2023-04-25 DIAGNOSIS — Z8619 Personal history of other infectious and parasitic diseases: Secondary | ICD-10-CM

## 2023-04-25 DIAGNOSIS — R6889 Other general symptoms and signs: Secondary | ICD-10-CM

## 2023-04-25 DIAGNOSIS — K5900 Constipation, unspecified: Secondary | ICD-10-CM | POA: Diagnosis not present

## 2023-04-25 DIAGNOSIS — K219 Gastro-esophageal reflux disease without esophagitis: Secondary | ICD-10-CM

## 2023-04-25 DIAGNOSIS — J069 Acute upper respiratory infection, unspecified: Secondary | ICD-10-CM

## 2023-04-25 DIAGNOSIS — J4541 Moderate persistent asthma with (acute) exacerbation: Secondary | ICD-10-CM

## 2023-04-25 LAB — EHRLICHIA ANTIBODY PANEL
E chaffeensis (HGE) Ab, IgG: NEGATIVE
E chaffeensis (HGE) Ab, IgM: NEGATIVE
E. Chaffeensis (HME) IgM Titer: NEGATIVE
E.Chaffeensis (HME) IgG: NEGATIVE

## 2023-04-25 MED ORDER — ALUM & MAG HYDROXIDE-SIMETH 200-200-20 MG/5ML PO SUSP
30.0000 mL | Freq: Once | ORAL | Status: AC
  Administered 2023-04-25: 30 mL via ORAL

## 2023-04-25 MED ORDER — OMEPRAZOLE 20 MG PO CPDR: 20.0000 mg | DELAYED_RELEASE_CAPSULE | Freq: Every day | ORAL | 0 refills | Status: AC

## 2023-04-25 MED ORDER — POLYETHYLENE GLYCOL 3350 17 GM/SCOOP PO POWD: 17.0000 g | Freq: Every day | ORAL | 5 refills | Status: DC

## 2023-04-25 MED ORDER — PREDNISONE 50 MG PO TABS: 50.0000 mg | ORAL_TABLET | Freq: Every day | ORAL | 0 refills | Status: AC

## 2023-04-25 MED ORDER — LIDOCAINE VISCOUS HCL 2 % MT SOLN
15.0000 mL | Freq: Once | OROMUCOSAL | Status: AC
  Administered 2023-04-25: 15 mL via OROMUCOSAL

## 2023-04-25 NOTE — ED Provider Notes (Signed)
[facility]    CSN: [id number] Arrival date & time: 04/25/23  1020      History   Chief Complaint Chief Complaint  Patient presents with   dry throat   Heartburn   Nasal Congestion   Fatigue    HPI Jasmin Robinson is a 37 y.o. female.   HPI  History obtained from the patient. [name] presents for multiple concerns including dry throat, cough, heart burn, nasal congestion, intermittent facial rash, constipation, unilateral eye drainage, joint pain, ear discomfort and fatigue. Has been feeling really hot and been breaking out in sweats.    Has rash on face and neck and chest for the past 2 weeks.  Put some hydrocortisone on it without relief.    Reports right eye drainage for several months and believes she has a blocked tear duct. The cause of this has not been identified.    Has history of Ehrlichiosis and has taken antibiotics without relief.  Unsure if her current symptoms are related or not.   Has been out of her reflux medication for awhile. Unsure which medication she takes.   Has cold symptoms and both of her daughters are also sick.  Has asthma and needs new nebulizer tubing.       Past Medical History:  Diagnosis Date   Anxiety    Fibromyalgia    Migraines     There are no problems to display for this patient.   Past Surgical History:  Procedure Laterality Date   ABDOMINAL SURGERY     CESAREAN SECTION      OB History   No obstetric history on file.      Home Medications    Prior to Admission medications   Medication Sig Start Date End Date Taking? Authorizing Provider  gabapentin (NEURONTIN) 600 MG tablet TAKE 1 TABLET BY MOUTH FOUR TIMES DAILY WITH MEALS 12/03/19  Yes [provider]  medroxyPROGESTERone (DEPO-PROVERA) 150 MG/ML injection Inject into the muscle.   Yes [provider]  polyethylene glycol powder (GLYCOLAX/MIRALAX) 17 GM/SCOOP powder Take 17 g by mouth daily. 04/25/23  Yes Saki Legore, DO   predniSONE (DELTASONE) 50 MG tablet Take 1 tablet (50 mg total) by mouth daily for 5 days. 04/25/23 04/30/23 Yes Varina Hulon, DO  albuterol (PROVENTIL HFA;VENTOLIN HFA) 108 (90 Base) MCG/ACT inhaler Inhale into the lungs every 6 (six) hours as needed for wheezing or shortness of breath.    [provider]  busPIRone (BUSPAR) 7.5 MG tablet Take 1 tablet (7.5 mg total) by mouth 2 (two) times daily. 06/22/21   Becky Augusta, NP  Cholecalciferol (VITAMIN D3) 10 MCG (400 UNIT) tablet Take 800 Units by mouth daily. 02/10/23   [provider]  DULoxetine (CYMBALTA) 20 MG capsule Take by mouth.    [provider]  fluticasone (FLONASE) 50 MCG/ACT nasal spray Place 2 sprays into both nostrils daily. 02/12/21   Domenick Gong, MD  hydrOXYzine (ATARAX) 25 MG tablet Take 1 tablet (25 mg total) by mouth 3 (three) times daily as needed for anxiety. 01/18/22   Minna Antis, MD  omeprazole (PRILOSEC) 20 MG capsule Take 1 capsule (20 mg total) by mouth daily. 04/25/23   Sandon Yoho, Seward Meth, DO  tiZANidine (ZANAFLEX) 4 MG tablet Take 4 mg by mouth 2 (two) times daily as needed.    [provider]  citalopram (CELEXA) 10 MG tablet Take 10 mg by mouth daily.  01/20/19  [provider]  topiramate (TOPAMAX) 25 MG capsule Take 25 mg  by mouth 2 (two) times daily.  01/20/19  [provider]    Family History History reviewed. No pertinent family history.  Social History Social History   Tobacco Use   Smoking status: Former    Types: Cigarettes   Smokeless tobacco: Never  Vaping Use   Vaping status: Some Days  Substance Use Topics   Alcohol use: Yes   Drug use: Never     Allergies   Sulfa antibiotics   Review of Systems Review of Systems: negative unless otherwise stated in HPI.      Physical Exam Triage Vital Signs ED Triage Vitals [04/25/23 1217]  Encounter Vitals Group     BP 126/89     Systolic BP Percentile      Diastolic BP  Percentile      Pulse Rate 61     Resp 17     Temp 98.6 F (37 C)     Temp Source Oral     SpO2 100 %     Weight      Height      Head Circumference      Peak Flow      Pain Score 8     Pain Loc      Pain Education      Exclude from Growth Chart    No data found.  Updated Vital Signs BP 126/89 (BP Location: Left Arm)   Pulse 61   Temp 98.6 F (37 C) (Oral)   Resp 17   SpO2 100%   Visual Acuity Right Eye Distance:   Left Eye Distance:   Bilateral Distance:    Right Eye Near:   Left Eye Near:    Bilateral Near:     Physical Exam GEN:     alert, non-toxic appearing female in no distress    HENT:  mucus membranes moist, oropharyngeal without lesions or erythema, no tonsillar hypertrophy or exudates, no nasal discharge, bilateral TM normal EYES:   pupils equal and reactive, no scleral injection or discharge NECK:  normal ROM, no meningismus   RESP:  no increased work of breathing, faint expiratory wheezing CVS:   regular rate and rhythm Skin:   warm and dry, maculopapular erythematous patches on face     UC Treatments / Results  Labs (all labs ordered are listed, but only abnormal results are displayed) Labs Reviewed - No data to display  EKG   Radiology No results found.  Procedures Procedures (including critical care time)  Medications Ordered in UC Medications  lidocaine (XYLOCAINE) 2 % viscous mouth solution 15 mL (15 mLs Mouth/Throat Given 04/25/23 1338)  alum & mag hydroxide-simeth (MAALOX/MYLANTA) 200-200-20 MG/5ML suspension 30 mL (30 mLs Oral Given 04/25/23 1338)    Initial Impression / Assessment and Plan / UC Course  I have reviewed the triage vital signs and the nursing notes.  Pertinent labs & imaging results that were available during my care of the patient were reviewed by me and considered in my medical decision making (see chart for details).       Pt is a 37 y.o. female who presents for multiple somatic complaints. Velvie is  afebrile here without recent antipyretics. Satting well on room air. Overall pt is non-toxic appearing, well hydrated, without respiratory distress. Pulmonary exam is remarkable for faint expiratory wheezing.  COVID testing deferred due to duration of symptoms and daughter's COVID test is negative here today.  DME order for nebulizer tubing placed.  She was provided with tubing prior  to discharge. Prescribed steroid burst for asthma and concern for tick borne flare up. Had positive Ehrlichiosis antibodies on 03/16/23.    Constipation: Refilled Miralax for chronic constipation. Has history of rectal prolapse  GERD: acute on chronic.  Refilled Omeprazole as this was prescribed previously with relief of symptoms    Return and ED precautions given and voiced understanding. Discussed MDM, treatment plan and plan for follow-up with patient who agrees with plan.     Final Clinical Impressions(s) / UC Diagnoses   Final diagnoses:  Viral URI with cough  Constipation, unspecified constipation type  Gastroesophageal reflux disease without esophagitis  Moderate persistent asthma with acute exacerbation  Hx of ehrlichiosis  Somatic complaints, multiple     Discharge Instructions      Stop by the pharmacy to pick up your prescriptions.  Follow up with your primary care provider as needed.       ED Prescriptions     Medication Sig Dispense Auth. Provider   omeprazole (PRILOSEC) 20 MG capsule Take 1 capsule (20 mg total) by mouth daily. 30 capsule Karmelo Bass, DO   polyethylene glycol powder (GLYCOLAX/MIRALAX) 17 GM/SCOOP powder Take 17 g by mouth daily. 500 g Lani Mendiola, DO   predniSONE (DELTASONE) 50 MG tablet Take 1 tablet (50 mg total) by mouth daily for 5 days. 5 tablet Katha Cabal, DO      PDMP not reviewed this encounter.   Katha Cabal, DO 04/25/23 1631

## 2023-04-25 NOTE — Discharge Instructions (Signed)
Stop by the pharmacy to pick up your prescriptions.  Follow up with your primary care provider as needed.

## 2023-04-25 NOTE — ED Triage Notes (Signed)
Sx fo months per patient.   Rash on cheeks that comes and goes.  Dry throat Acid reflux Fatigue.

## 2023-06-14 DIAGNOSIS — N393 Stress incontinence (female) (male): Secondary | ICD-10-CM | POA: Insufficient documentation

## 2023-06-14 DIAGNOSIS — N816 Rectocele: Secondary | ICD-10-CM | POA: Insufficient documentation

## 2023-06-14 DIAGNOSIS — N8110 Cystocele, unspecified: Secondary | ICD-10-CM | POA: Insufficient documentation

## 2023-10-19 ENCOUNTER — Ambulatory Visit

## 2023-10-19 ENCOUNTER — Ambulatory Visit
Admission: EM | Admit: 2023-10-19 | Discharge: 2023-10-19 | Disposition: A | Discharge status: Home / Self Care | Attending: Family Medicine | Admitting: Family Medicine

## 2023-10-19 VITALS — BP 141/94 | HR 61 | Temp 97.9°F | Resp 16

## 2023-10-19 DIAGNOSIS — K5900 Constipation, unspecified: Secondary | ICD-10-CM | POA: Diagnosis not present

## 2023-10-19 DIAGNOSIS — R052 Subacute cough: Secondary | ICD-10-CM

## 2023-10-19 DIAGNOSIS — R1084 Generalized abdominal pain: Secondary | ICD-10-CM | POA: Diagnosis not present

## 2023-10-19 DIAGNOSIS — J020 Streptococcal pharyngitis: Secondary | ICD-10-CM

## 2023-10-19 LAB — CBC WITH DIFFERENTIAL/PLATELET
Abs Immature Granulocytes: 0.05 10*3/uL (ref 0.00–0.07)
Basophils Absolute: 0.1 10*3/uL (ref 0.0–0.1)
Basophils Relative: 1 %
Eosinophils Absolute: 0.5 10*3/uL (ref 0.0–0.5)
Eosinophils Relative: 6 %
HCT: 41.6 % (ref 36.0–46.0)
Hemoglobin: 13.1 g/dL (ref 12.0–15.0)
Immature Granulocytes: 1 %
Lymphocytes Relative: 22 %
Lymphs Abs: 2 10*3/uL (ref 0.7–4.0)
MCH: 25.8 pg — ABNORMAL LOW (ref 26.0–34.0)
MCHC: 31.5 g/dL (ref 30.0–36.0)
MCV: 81.9 fL (ref 80.0–100.0)
Monocytes Absolute: 0.7 10*3/uL (ref 0.1–1.0)
Monocytes Relative: 8 %
Neutro Abs: 5.8 10*3/uL (ref 1.7–7.7)
Neutrophils Relative %: 62 %
Platelets: 265 10*3/uL (ref 150–400)
RBC: 5.08 MIL/uL (ref 3.87–5.11)
RDW: 14.9 % (ref 11.5–15.5)
WBC: 9.1 10*3/uL (ref 4.0–10.5)
nRBC: 0 % (ref 0.0–0.2)

## 2023-10-19 LAB — URINALYSIS, W/ REFLEX TO CULTURE (INFECTION SUSPECTED)
Bilirubin Urine: NEGATIVE
Glucose, UA: NEGATIVE mg/dL
Hgb urine dipstick: NEGATIVE
Ketones, ur: NEGATIVE mg/dL
Leukocytes,Ua: NEGATIVE
Nitrite: NEGATIVE
Protein, ur: NEGATIVE mg/dL
Specific Gravity, Urine: 1.02 (ref 1.005–1.030)
pH: 5.5 (ref 5.0–8.0)

## 2023-10-19 LAB — COMPREHENSIVE METABOLIC PANEL WITH GFR
ALT: 16 U/L (ref 0–44)
AST: 15 U/L (ref 15–41)
Albumin: 4.3 g/dL (ref 3.5–5.0)
Alkaline Phosphatase: 62 U/L (ref 38–126)
Anion gap: 8 (ref 5–15)
BUN: 12 mg/dL (ref 6–20)
CO2: 22 mmol/L (ref 22–32)
Calcium: 8.9 mg/dL (ref 8.9–10.3)
Chloride: 105 mmol/L (ref 98–111)
Creatinine, Ser: 0.8 mg/dL (ref 0.44–1.00)
GFR, Estimated: >60 mL/min (ref >60)
Glucose, Bld: 98 mg/dL (ref 70–99)
Potassium: 4.1 mmol/L (ref 3.5–5.1)
Sodium: 135 mmol/L (ref 135–145)
Total Bilirubin: 0.7 mg/dL (ref 0.0–1.2)
Total Protein: 7.7 g/dL (ref 6.5–8.1)

## 2023-10-19 LAB — GROUP A STREP BY PCR: Group A Strep by PCR: DETECTED — AB

## 2023-10-19 LAB — LIPASE, BLOOD: Lipase: 23 U/L (ref 11–51)

## 2023-10-19 LAB — PREGNANCY, URINE: Preg Test, Ur: NEGATIVE

## 2023-10-19 MED ORDER — AMOXICILLIN-POT CLAVULANATE 875-125 MG PO TABS: 1.0000 | ORAL_TABLET | Freq: Two times a day (BID) | ORAL | 0 refills | Status: AC

## 2023-10-19 NOTE — Discharge Instructions (Addendum)
 Take the Augmentin twice daily for 10 days for treatment of your strep throat.  Gargle with warm salt water 2-3 times a day to soothe your throat, aid in pain relief, and aid in healing.  Take over-the-counter ibuprofen according to the package instructions as needed for pain.  You can also use Chloraseptic or Sucrets lozenges, 1 lozenge every 2 hours as needed for throat pain.  Increase your oral water intake so that you are remaining well-hydrated and there is increased water to help soften your bowel.  Purchase an 8 ounce bottle of MiraLAX along with Gatorade or Powerade.  Mix half a bottle of MiraLAX in 32 ounces of liquid and drink it slowly over 2 hours.  You can also take 2 over-the-counter Dulcolax tablets along with your first dose of MiraLAX to help move your stools along.  If you do not have a bowel movement by the morning makes the catheter half bottle of MiraLAX and an additional 32 ounces of fluid and again consume it over a 2-hour period.  Then going forward I recommend you take 1 capful of MiraLAX in 8 ounces of a beverage of your choice daily to help maintain regular bowel movements.  If you develop any sharp increase in abdominal pain, nausea or vomiting you cannot keep down fluids, fever, or start passing blood in your stool you need to go to the ER for evaluation.  Increase your oral dietary fiber intake so as to add bulk to your stool and help decrease incidence of constipation.  If you have any increasing abdominal pain, blood in your stool, abdominal bloating, or fever please follow-up in the emergency department for evaluation. Marland Kitchen

## 2023-10-19 NOTE — ED Provider Notes (Addendum)
 [facility]    CSN: [id number] Arrival date & time: 10/19/23  1152      History   Chief Complaint Chief Complaint  Patient presents with   Diarrhea    HPI Jasmin Robinson is a 38 y.o. female.   HPI  38 year old female with past medical history significant for fibromyalgia, migraine headaches, anxiety, and polyarthralgia presents for evaluation of multiple complaints.  Her first complaint is that she has been experiencing fatigue.  This has been an ongoing issue.  Additionally, the patient is complaining of runny nose, nasal congestion though she thinks this is associated with her allergies.  She has been experiencing night sweats and feeling cold but reports that this has been a long-term issue as well.  She is also been experiencing a nonproductive cough with some shortness of breath and wheezing.  Nausea and vomiting several times over the last 3 days, and a decreased appetite.  No fever.  Patient also has not had a menstrual cycle since November.  She reports that she was experiencing diarrhea until a few days ago and now she is experiencing constipation.  Past Medical History:  Diagnosis Date   Anxiety    Fibromyalgia    Migraines     There are no active problems to display for this patient.   Past Surgical History:  Procedure Laterality Date   ABDOMINAL SURGERY     CESAREAN SECTION      OB History   No obstetric history on file.      Home Medications    Prior to Admission medications   Medication Sig Start Date End Date Taking? Authorizing Provider  amoxicillin-clavulanate (AUGMENTIN) 875-125 MG tablet Take 1 tablet by mouth every 12 (twelve) hours for 10 days. 10/19/23 10/29/23 Yes Becky Augusta, NP  hydrOXYzine (ATARAX) 25 MG tablet Take 1 tablet (25 mg total) by mouth 3 (three) times daily as needed for anxiety. 01/18/22  Yes Minna Antis, MD  albuterol (PROVENTIL HFA;VENTOLIN HFA) 108 (90 Base) MCG/ACT inhaler Inhale into the lungs every 6 (six)  hours as needed for wheezing or shortness of breath.    [provider]  busPIRone (BUSPAR) 7.5 MG tablet Take 1 tablet (7.5 mg total) by mouth 2 (two) times daily. 06/22/21   Becky Augusta, NP  Cholecalciferol (VITAMIN D3) 10 MCG (400 UNIT) tablet Take 800 Units by mouth daily. 02/10/23   [provider]  DULoxetine (CYMBALTA) 20 MG capsule Take by mouth.    [provider]  fluticasone (FLONASE) 50 MCG/ACT nasal spray Place 2 sprays into both nostrils daily. 02/12/21   Domenick Gong, MD  gabapentin (NEURONTIN) 600 MG tablet TAKE 1 TABLET BY MOUTH FOUR TIMES DAILY WITH MEALS 12/03/19   [provider]  medroxyPROGESTERone (DEPO-PROVERA) 150 MG/ML injection Inject into the muscle.    [provider]  omeprazole (PRILOSEC) 20 MG capsule Take 1 capsule (20 mg total) by mouth daily. 04/25/23   Brimage, Seward Meth, DO  polyethylene glycol powder (GLYCOLAX/MIRALAX) 17 GM/SCOOP powder Take 17 g by mouth daily. 04/25/23   Brimage, Seward Meth, DO  tiZANidine (ZANAFLEX) 4 MG tablet Take 4 mg by mouth 2 (two) times daily as needed.    [provider]  citalopram (CELEXA) 10 MG tablet Take 10 mg by mouth daily.  01/20/19  [provider]  topiramate (TOPAMAX) 25 MG capsule Take 25 mg by mouth 2 (two) times daily.  01/20/19  [provider]    Family History History reviewed. No pertinent family history.  Social History Social History   Tobacco Use   Smoking status: Former    Types: Cigarettes   Smokeless tobacco: Never  Vaping Use   Vaping status: Some Days  Substance Use Topics   Alcohol use: Yes   Drug use: Never     Allergies   Sulfa antibiotics and Sulfamethoxazole-trimethoprim   Review of Systems Review of Systems  Constitutional:  Positive for appetite change. Negative for fever.  HENT:  Positive for congestion and rhinorrhea. Negative for sore throat.   Respiratory:  Positive for cough, shortness of breath and wheezing.    Gastrointestinal:  Positive for abdominal pain, constipation, diarrhea, nausea and vomiting.  Skin:        Dry, itchy skin.     Physical Exam Triage Vital Signs ED Triage Vitals  Encounter Vitals Group     BP      Systolic BP Percentile      Diastolic BP Percentile      Pulse      Resp      Temp      Temp src      SpO2      Weight      Height      Head Circumference      Peak Flow      Pain Score      Pain Loc      Pain Education      Exclude from Growth Chart    No data found.  Updated Vital Signs BP (!) 141/94 (BP Location: Left Arm)   Pulse 61   Temp 97.9 F (36.6 C) (Oral)   Resp 16   LMP  (LMP Unknown)   SpO2 97%   Visual Acuity Right Eye Distance:   Left Eye Distance:   Bilateral Distance:    Right Eye Near:   Left Eye Near:    Bilateral Near:     Physical Exam Vitals and nursing note reviewed.  Constitutional:      Appearance: Normal appearance. She is not ill-appearing.  HENT:     Head: Normocephalic and atraumatic.     Nose: Congestion and rhinorrhea present.     Comments: This mucosa is mildly edematous and slightly pale with scant clear discharge in both nares.    Mouth/Throat:     Mouth: Mucous membranes are moist.     Pharynx: Oropharynx is clear. Posterior oropharyngeal erythema present. No oropharyngeal exudate.     Comments: Mild erythema to the uvula without significant edema.  Also mild erythema to the posterior oropharynx without injection.  Tonsillar pillars are unremarkable. Cardiovascular:     Rate and Rhythm: Normal rate and regular rhythm.     Pulses: Normal pulses.     Heart sounds: Normal heart sounds. No murmur heard.    No friction rub. No gallop.  Pulmonary:     Effort: Pulmonary effort is normal.     Breath sounds: Normal breath sounds. No wheezing, rhonchi or rales.  Abdominal:     Palpations: Abdomen is soft.     Tenderness: There is abdominal tenderness. There is no guarding or rebound.  Musculoskeletal:      Cervical back: Normal range of motion and neck supple. No tenderness.  Lymphadenopathy:     Cervical: No cervical adenopathy.  Skin:    General: Skin is warm and dry.     Capillary Refill: Capillary refill takes less than 2 seconds.     Findings: No rash.  Neurological:     General:  No focal deficit present.     Mental Status: She is alert and oriented to person, place, and time.      UC Treatments / Results  Labs (all labs ordered are listed, but only abnormal results are displayed) Labs Reviewed  GROUP A STREP BY PCR - Abnormal; Notable for the following components:      Result Value   Group A Strep by PCR DETECTED (*)    All other components within normal limits  CBC WITH DIFFERENTIAL/PLATELET - Abnormal; Notable for the following components:   MCH 25.8 (*)    All other components within normal limits  URINALYSIS, W/ REFLEX TO CULTURE (INFECTION SUSPECTED) - Abnormal; Notable for the following components:   Bacteria, UA FEW (*)    All other components within normal limits  COMPREHENSIVE METABOLIC PANEL WITH GFR  PREGNANCY, URINE  LIPASE, BLOOD    EKG   Radiology No results found.  Procedures Procedures (including critical care time)  Medications Ordered in UC Medications - No data to display  Initial Impression / Assessment and Plan / UC Course  I have reviewed the triage vital signs and the nursing notes.  Pertinent labs & imaging results that were available during my care of the patient were reviewed by me and considered in my medical decision making (see chart for details).   Patient is a nontoxic-appearing 38 year old female presenting for evaluation of multiple plaints of them outlined in the HPI above.  The patient recently had a positive ANA and was evaluated by rheumatology at [facility].  She was diagnosed with polyarthralgia and has not followed back up with rheumatology.  At that last appointment dated 08/16/2023 the patient had lab work ordered that  included a CK with a level of 240, complement 3 with a level of 168, complement 4 with a level of 39, C-reactive protein elevated at 19, elevated anticarpi antibody at 20, ESR of 37.  She is not currently taking any medication for autoimmune disease or to treat potential rheumatoid arthritis.  She reports that she does have a history of asthma and allergies and is concerned about possible sleep apnea because her mom has it.  She reports that she will wake up startled in the middle of the night.  In the exam room she is not in any acute distress and she is able to speak in full sentence without dyspnea or tachypnea.  Her physical exam does reveal inflammation of her nasal mucosa with scant clear nasal discharge.  Also erythema to the posterior oropharynx with no evidence of injection, tonsillar hypertrophy, or exudate.  However, I will order a strep PCR to rule out the presence of strep as her daughter has strep throat symptoms at present.  No cervical lymphadenopathy present on exam.  Cardiopulmonary exam reveals clear lung sounds in all fields.  Abdomen is soft but diffusely tender.  No guarding or rebound.  The patient does have a slightly pale complexion.  I will order a CBC to evaluate for any systemic infection or anemia, CMP and lipase to evaluate for any elevation of her transaminases or possible gallbladder inflammation, urinalysis as patient states she feels like she is not urinating as much as she should though she denies pain with urination, urine pregnancy test because she has not had a menstrual cycle in the last 6 months, chest x-ray to look for any acute cardiopulmonary process, and two-view abdomen to evaluate for any acute intra-abdominal process.  Patient is stating that she does not  want a urine pregnancy test because she is not currently in a relationship.  Strep PCR is positive.  Abdominal x-rays independently reviewed and evaluated by me.  Impression: There is a large stool burden  scattered throughout the patient's colon.  Nonspecific air-fluid levels.  Radiology read is pending. Radiology impression states large volume of stool material in the colon.  Chest x-ray independent reviewed and evaluated by me.  Impression: Lung fields are well aerated without evidence of infiltrate or effusion.  Cardiomediastinal silhouette appears normal.  Radiology overread is pending. Radiology impression states no active cardiopulmonary disease.  CBC shows a normal white count, H&H, and platelets.  MCH is mildly decreased at 25.8.  No abnormalities to the fracture.  Lipase is normal at 23.  CMP is completely unremarkable.  Urine pregnancy test is negative.  Urinalysis is negative for leukocyte esterase, nitrates, or protein.  Also no hemoglobin.  Reflex microscopy shows 6-10 WBCs but also 6-10 squamous epithelials indicating skin contamination.  Few bacteria and mucus present.  I will discharge patient home with a diagnosis of strep pharyngitis and constipation I will start her on Augmentin 875 mg twice daily for 10 days for treatment of strep pharyngitis.  Tylenol and/or ibuprofen as needed for any fever, pain, or bodyaches.  I will give the patient constipation cleanout protocol using MiraLAX and over-the-counter Dulcolax.  I will have her follow-up with her rheumatologist.   Final Clinical Impressions(s) / UC Diagnoses   Final diagnoses:  Subacute cough  Generalized abdominal pain  Strep pharyngitis  Constipation, unspecified constipation type     Discharge Instructions      Take the Augmentin twice daily for 10 days for treatment of your strep throat.  Gargle with warm salt water 2-3 times a day to soothe your throat, aid in pain relief, and aid in healing.  Take over-the-counter ibuprofen according to the package instructions as needed for pain.  You can also use Chloraseptic or Sucrets lozenges, 1 lozenge every 2 hours as needed for throat pain.  Increase your oral  water intake so that you are remaining well-hydrated and there is increased water to help soften your bowel.  Purchase an 8 ounce bottle of MiraLAX along with Gatorade or Powerade.  Mix half a bottle of MiraLAX in 32 ounces of liquid and drink it slowly over 2 hours.  You can also take 2 over-the-counter Dulcolax tablets along with your first dose of MiraLAX to help move your stools along.  If you do not have a bowel movement by the morning makes the catheter half bottle of MiraLAX and an additional 32 ounces of fluid and again consume it over a 2-hour period.  Then going forward I recommend you take 1 capful of MiraLAX in 8 ounces of a beverage of your choice daily to help maintain regular bowel movements.  If you develop any sharp increase in abdominal pain, nausea or vomiting you cannot keep down fluids, fever, or start passing blood in your stool you need to go to the ER for evaluation.  Increase your oral dietary fiber intake so as to add bulk to your stool and help decrease incidence of constipation.  If you have any increasing abdominal pain, blood in your stool, abdominal bloating, or fever please follow-up in the emergency department for evaluation. .      ED Prescriptions     Medication Sig Dispense Auth. Provider   amoxicillin-clavulanate (AUGMENTIN) 875-125 MG tablet Take 1 tablet by mouth every 12 (twelve) hours for  10 days. 20 tablet Becky Augusta, NP      I have reviewed the PDMP during this encounter.   Becky Augusta, NP 10/19/23 1437    Becky Augusta, NP 10/19/23 [phone]

## 2023-10-19 NOTE — ED Triage Notes (Signed)
 Fatigue (patient dx with RA) Diarrhea x 1 month that stopped a few days ago. Patient states that now she's constipated.  Patient is eating protein bars and olives.  No period since November( was on depo) Patient states that she also has itchy skin all over her body for weeks.

## 2023-10-27 DIAGNOSIS — M67449 Ganglion, unspecified hand: Secondary | ICD-10-CM | POA: Insufficient documentation

## 2023-11-02 DIAGNOSIS — M797 Fibromyalgia: Secondary | ICD-10-CM | POA: Insufficient documentation

## 2023-11-02 DIAGNOSIS — R635 Abnormal weight gain: Secondary | ICD-10-CM | POA: Insufficient documentation

## 2023-11-27 ENCOUNTER — Other Ambulatory Visit: Payer: Self-pay

## 2023-11-27 ENCOUNTER — Emergency Department

## 2023-11-27 ENCOUNTER — Emergency Department
Admission: EM | Admit: 2023-11-27 | Discharge: 2023-11-27 | Disposition: A | Discharge status: Home / Self Care | Attending: Emergency Medicine | Admitting: Emergency Medicine

## 2023-11-27 ENCOUNTER — Encounter: Payer: Self-pay | Admitting: Emergency Medicine

## 2023-11-27 DIAGNOSIS — M546 Pain in thoracic spine: Secondary | ICD-10-CM | POA: Insufficient documentation

## 2023-11-27 DIAGNOSIS — M549 Dorsalgia, unspecified: Secondary | ICD-10-CM

## 2023-11-27 DIAGNOSIS — R0789 Other chest pain: Secondary | ICD-10-CM | POA: Insufficient documentation

## 2023-11-27 DIAGNOSIS — R079 Chest pain, unspecified: Secondary | ICD-10-CM | POA: Diagnosis present

## 2023-11-27 LAB — CBC WITH DIFFERENTIAL/PLATELET
Abs Immature Granulocytes: 0.04 10*3/uL (ref 0.00–0.07)
Basophils Absolute: 0.1 10*3/uL (ref 0.0–0.1)
Basophils Relative: 1 %
Eosinophils Absolute: 0.6 10*3/uL — ABNORMAL HIGH (ref 0.0–0.5)
Eosinophils Relative: 6 %
HCT: 38.3 % (ref 36.0–46.0)
Hemoglobin: 12.3 g/dL (ref 12.0–15.0)
Immature Granulocytes: 0 %
Lymphocytes Relative: 20 %
Lymphs Abs: 2 10*3/uL (ref 0.7–4.0)
MCH: 26.4 pg (ref 26.0–34.0)
MCHC: 32.1 g/dL (ref 30.0–36.0)
MCV: 82.2 fL (ref 80.0–100.0)
Monocytes Absolute: 0.8 10*3/uL (ref 0.1–1.0)
Monocytes Relative: 8 %
Neutro Abs: 6.6 10*3/uL (ref 1.7–7.7)
Neutrophils Relative %: 65 %
Platelets: 245 10*3/uL (ref 150–400)
RBC: 4.66 MIL/uL (ref 3.87–5.11)
RDW: 14.9 % (ref 11.5–15.5)
WBC: 10 10*3/uL (ref 4.0–10.5)
nRBC: 0 % (ref 0.0–0.2)

## 2023-11-27 LAB — BASIC METABOLIC PANEL WITH GFR
Anion gap: 6 (ref 5–15)
BUN: 11 mg/dL (ref 6–20)
CO2: 20 mmol/L — ABNORMAL LOW (ref 22–32)
Calcium: 8.5 mg/dL — ABNORMAL LOW (ref 8.9–10.3)
Chloride: 110 mmol/L (ref 98–111)
Creatinine, Ser: 0.63 mg/dL (ref 0.44–1.00)
GFR, Estimated: >60 mL/min (ref >60)
Glucose, Bld: 96 mg/dL (ref 70–99)
Potassium: 4.3 mmol/L (ref 3.5–5.1)
Sodium: 136 mmol/L (ref 135–145)

## 2023-11-27 LAB — TROPONIN I (HIGH SENSITIVITY): Troponin I (High Sensitivity): 3 ng/L (ref <18)

## 2023-11-27 LAB — HCG, QUANTITATIVE, PREGNANCY: hCG, Beta Chain, Quant, S: <1 m[IU]/mL (ref <5)

## 2023-11-27 MED ORDER — IOHEXOL 350 MG/ML SOLN
75.0000 mL | Freq: Once | INTRAVENOUS | Status: AC | PRN
  Administered 2023-11-27: 75 mL via INTRAVENOUS

## 2023-11-27 MED ORDER — OXYCODONE-ACETAMINOPHEN 5-325 MG PO TABS: 1.0000 | ORAL_TABLET | ORAL | 0 refills | Status: AC | PRN

## 2023-11-27 MED ORDER — LIDOCAINE 5 % EX PTCH
1.0000 | MEDICATED_PATCH | Freq: Two times a day (BID) | CUTANEOUS | 0 refills | Status: AC
Start: 2023-11-27 — End: 2024-11-26
  Filled 2024-01-03: qty 10, 5d supply, fill #0

## 2023-11-27 MED ORDER — LIDOCAINE 5 % EX PTCH
1.0000 | MEDICATED_PATCH | CUTANEOUS | Status: DC
  Administered 2023-11-27: 1 via TRANSDERMAL
  Filled 2023-11-27: qty 1

## 2023-11-27 NOTE — ED Triage Notes (Signed)
 Pt arrives via EMS from home with reports of back pain and chest pain for a few days that worsened yesterday. EMS reports HTN and 10/10 pain and gave pt 75 mcg fentanyl, then 100 mcg. 324 ASA and 4 mg zofran  also given.

## 2023-11-27 NOTE — ED Provider Notes (Signed)
 [facility] Provider Note    Event Date/Time   First MD Initiated Contact with Patient 11/27/23 1033     (approximate)   History   Chief Complaint Chest Pain and Back Pain   HPI  Jasmin Robinson is a 38 y.o. female with past medical history of anxiety, migraines, and fibromyalgia who presents to the ED complaining of chest pain.  Patient reports that over the past 24 hours she has been dealing with pain that seems to start in her left upper back, radiating towards her left chest.  This been associated with difficulty breathing and she states that yesterday the pain seemed to get acutely worse as she tried to get up out of bed.  She denies any falls or other trauma, does endorse a dry cough but denies any fevers.  She has not had any pain or swelling in her legs, does describe similar episodes of pain about every 6 months with no clear etiology.  She states she has not been seen in the ED for the symptoms in the past.  Per EMS, patient was in severe pain on arrival, given a total of 175 mcg of fentanyl during transport.  Patient now reports pain is significantly improved.      Physical Exam   Triage Vital Signs: ED Triage Vitals  Encounter Vitals Group     BP 11/27/23 1030 (!) 142/106     Systolic BP Percentile --      Diastolic BP Percentile --      Pulse Rate 11/27/23 1030 62     Resp 11/27/23 1030 14     Temp 11/27/23 1030 97.8 F (36.6 C)     Temp Source 11/27/23 1030 Oral     SpO2 11/27/23 1030 100 %     Weight 11/27/23 1032 230 lb (104.3 kg)     Height 11/27/23 1032 5\' 7"  (1.702 m)     Head Circumference --      Peak Flow --      Pain Score 11/27/23 1032 5     Pain Loc --      Pain Education --      Exclude from Growth Chart --     Most recent vital signs: Vitals:   11/27/23 1100 11/27/23 1230  BP: 113/69 133/83  Pulse: 61 (!) 57  Resp: 19 17  Temp:    SpO2: 96% 99%    Constitutional: Alert and oriented. Eyes: Conjunctivae are  normal. Head: Atraumatic. Nose: No congestion/rhinnorhea. Mouth/Throat: Mucous membranes are moist.  Cardiovascular: Normal rate, regular rhythm. Grossly normal heart sounds.  2+ radial pulses bilaterally. Respiratory: Normal respiratory effort.  No retractions. Lungs CTAB.  Left chest wall tenderness to palpation noted. Gastrointestinal: Soft and nontender. No distention. Musculoskeletal: No lower extremity tenderness nor edema.  Neurologic:  Normal speech and language. No gross focal neurologic deficits are appreciated.    ED Results / Procedures / Treatments   Labs (all labs ordered are listed, but only abnormal results are displayed) Labs Reviewed  CBC WITH DIFFERENTIAL/PLATELET - Abnormal; Notable for the following components:      Result Value   Eosinophils Absolute 0.6 (*)    All other components within normal limits  BASIC METABOLIC PANEL WITH GFR - Abnormal; Notable for the following components:   CO2 20 (*)    Calcium 8.5 (*)    All other components within normal limits  HCG, QUANTITATIVE, PREGNANCY  POC URINE PREG, ED  TROPONIN I (HIGH SENSITIVITY)  EKG  ED ECG REPORT I, Twilla Galea, the attending physician, personally viewed and interpreted this ECG.   Date: 11/27/2023  EKG Time: 10:30  Rate: 59  Rhythm: normal sinus rhythm  Axis: Normal  Intervals:none  ST&T Change: None  RADIOLOGY CTA chest reviewed and interpreted by me with pulmonary edema or focal infiltrate.  PROCEDURES:  Critical Care performed: No  Procedures   MEDICATIONS ORDERED IN ED: Medications  lidocaine  (LIDODERM ) 5 % 1 patch (1 patch Transdermal Patch Applied 11/27/23 1115)  iohexol  (OMNIPAQUE ) 350 MG/ML injection 75 mL (75 mLs Intravenous Contrast Given 11/27/23 1147)     IMPRESSION / MDM / ASSESSMENT AND PLAN / ED COURSE  I reviewed the triage vital signs and the nursing notes.                              38 y.o. female with past medical history of anxiety,  migraines, and fibromyalgia who presents to the ED with severe pain in her left back radiating towards her left chest over the past 24 hours.  Patient's presentation is most consistent with acute presentation with potential threat to life or bodily function.  Differential diagnosis includes, but is not limited to, ACS, PE, dissection, pneumonia, pneumothorax, musculoskeletal pain, GERD, anxiety.  Patient nontoxic-appearing and in no acute distress, vital signs are unremarkable.  EKG shows no evidence of arrhythmia or ischemia and symptoms seem atypical for ACS.  We will further assess with CTA for PE given acute severe pain, however pain now much improved following fentanyl with EMS.  Lab results are pending at this time.  CTA chest is negative for PE or other acute finding, troponin within normal limits and I doubt ACS.  Pregnancy testing is negative, additional labs without significant anemia, leukocytosis, electrolyte abnormality, or AKI.  With reassuring workup, suspect she is dealing with some form of thoracic radiculopathy.  No findings concerning for myelopathy and she states she has been following with [facility] for similar flareups.  She is appropriate for outpatient management and will be prescribed short course of pain medication.  She was counseled to return to the ED for new or worsening symptoms.  Patient agrees with plan.      FINAL CLINICAL IMPRESSION(S) / ED DIAGNOSES   Final diagnoses:  Atypical chest pain  Upper back pain on left side     Rx / DC Orders   ED Discharge Orders          Ordered    oxyCODONE -acetaminophen  (PERCOCET) 5-325 MG tablet  Every 4 hours PRN        11/27/23 1316    lidocaine  (LIDODERM ) 5 %  Every 12 hours        11/27/23 1316             Note:  This document was prepared using Dragon voice recognition software and may include unintentional dictation errors.   Twilla Galea, MD 11/27/23 1318

## 2023-11-27 NOTE — ED Notes (Signed)
 Patient transported to CT

## 2023-12-14 ENCOUNTER — Ambulatory Visit
Admission: EM | Admit: 2023-12-14 | Discharge: 2023-12-14 | Disposition: A | Discharge status: Home / Self Care | Attending: Emergency Medicine | Admitting: Emergency Medicine

## 2023-12-14 ENCOUNTER — Ambulatory Visit (INDEPENDENT_AMBULATORY_CARE_PROVIDER_SITE_OTHER)

## 2023-12-14 DIAGNOSIS — R6884 Jaw pain: Secondary | ICD-10-CM

## 2023-12-14 DIAGNOSIS — R110 Nausea: Secondary | ICD-10-CM | POA: Diagnosis not present

## 2023-12-14 DIAGNOSIS — K625 Hemorrhage of anus and rectum: Secondary | ICD-10-CM

## 2023-12-14 DIAGNOSIS — R140 Abdominal distension (gaseous): Secondary | ICD-10-CM

## 2023-12-14 DIAGNOSIS — S0083XA Contusion of other part of head, initial encounter: Secondary | ICD-10-CM | POA: Diagnosis not present

## 2023-12-14 LAB — CBC WITH DIFFERENTIAL/PLATELET
Abs Immature Granulocytes: 0.05 10*3/uL (ref 0.00–0.07)
Basophils Absolute: 0.1 10*3/uL (ref 0.0–0.1)
Basophils Relative: 1 %
Eosinophils Absolute: 0.6 10*3/uL — ABNORMAL HIGH (ref 0.0–0.5)
Eosinophils Relative: 6 %
HCT: 40 % (ref 36.0–46.0)
Hemoglobin: 12.6 g/dL (ref 12.0–15.0)
Immature Granulocytes: 1 %
Lymphocytes Relative: 17 %
Lymphs Abs: 1.7 10*3/uL (ref 0.7–4.0)
MCH: 26.1 pg (ref 26.0–34.0)
MCHC: 31.5 g/dL (ref 30.0–36.0)
MCV: 82.8 fL (ref 80.0–100.0)
Monocytes Absolute: 0.6 10*3/uL (ref 0.1–1.0)
Monocytes Relative: 6 %
Neutro Abs: 7.4 10*3/uL (ref 1.7–7.7)
Neutrophils Relative %: 69 %
Platelets: 279 10*3/uL (ref 150–400)
RBC: 4.83 MIL/uL (ref 3.87–5.11)
RDW: 15.2 % (ref 11.5–15.5)
WBC: 10.4 10*3/uL (ref 4.0–10.5)
nRBC: 0 % (ref 0.0–0.2)

## 2023-12-14 MED ORDER — ACETAMINOPHEN 325 MG PO TABS
975.0000 mg | ORAL_TABLET | Freq: Once | ORAL | Status: AC
  Administered 2023-12-14: 975 mg via ORAL

## 2023-12-14 MED ORDER — ONDANSETRON HCL 4 MG PO TABS: 4.0000 mg | ORAL_TABLET | Freq: Four times a day (QID) | ORAL | 0 refills | Status: AC

## 2023-12-14 MED ORDER — POLYETHYLENE GLYCOL 3350 17 GM/SCOOP PO POWD
17.0000 g | Freq: Every day | ORAL | 5 refills | Status: AC
  Filled 2024-02-15 – 2024-06-04 (×2): qty 476, 28d supply, fill #0

## 2023-12-14 MED ORDER — ACETAMINOPHEN 500 MG PO TABS: 500.0000 mg | ORAL_TABLET | Freq: Once | ORAL | Status: DC

## 2023-12-14 MED ORDER — ONDANSETRON 4 MG PO TBDP
4.0000 mg | ORAL_TABLET | Freq: Once | ORAL | Status: AC
Start: 2023-12-14 — End: 2023-12-14
  Administered 2023-12-14: 4 mg via ORAL

## 2023-12-14 NOTE — ED Triage Notes (Signed)
 Patient presents to UC for a fall. States she landed on her front side of body. Reports generalized pain r/t fall. Also complains of nausea and concern with rectal bleeding. Not treating with any OTC meds.

## 2023-12-14 NOTE — Discharge Instructions (Addendum)
 Your abdominal x-rays did not show any evidence of obstruction though you do have stool in your colon.  Your blood work also did not show any evidence of anemia.  Additionally, your jaw x-rays did also did not show any evidence of fracture.  Continue to take over-the-counter Tylenol  as needed for pain.  Use the Zofran  every 6 hours as needed for nausea and vomiting.  Continue to orally rehydrate using broth, water, ginger ale.  You may slowly advance your diet as tolerated.  Continue to use your MiraLAX  daily to help prevent constipation and help you have daily bowel movements.  You may apply ice to your chin for 20 minutes at a time, 2-3 times a day, as needed for pain and inflammation.  If you develop any increase in abdominal pain, nausea and vomiting or you cannot keep down fluids, dizziness, or have fainting spells you need to go to the emergency department for evaluation.

## 2023-12-14 NOTE — ED Provider Notes (Addendum)
 [facility]    CSN: [id number] Arrival date & time: 12/14/23  1430      History   Chief Complaint Chief Complaint  Patient presents with   Nausea   Fall   Back Pain    HPI Jasmin Robinson is a 38 y.o. female.   HPI  38 year old female with past medical history significant for migraine headaches, fibromyalgia, anxiety, generalized abdominal pain, rectal bleeding, internal hemorrhoids, and iron deficiency anemia presents for evaluation of pain in her mandible after suffering a ground-level fall 1 week ago.  She reports that she was carrying a pot to the kitchen, slipped, landed on her hands and knees and her chin struck the edge of the pot.  She is also complaining of nausea, abdominal distention, and rectal bleeding.  Bleeding began 2 days ago and she reports that this morning she passed a clot.  She denies fever or syncope.  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   Iron deficiency anemia due to chronic blood loss 01/20/2023   Rectal bleeding 01/20/2023   Night sweats 12/31/2022   Polyarthralgia 12/31/2022   Diarrhea 05/28/2022   Depression 03/11/2022   Seasonal allergies 10/21/2021   Rectal prolapse 08/04/2021   Otitis externa 11/06/2020   Urinary hesitancy 11/06/2020   Fatigue 10/02/2020   Atopic dermatitis 08/28/2020   Generalized abdominal pain 07/21/2020   Mild intermittent asthma, uncomplicated 07/21/2020   Personal history of other mental and behavioral disorders 07/21/2020   Contraception management 07/21/2020   Unspecified hemorrhoids 07/21/2020   Urticaria  07/21/2020   Hyperglycemia 03/29/2019   Increased frequency of urination 03/29/2019   Overweight 03/29/2019   Back pain 03/29/2019   Fibroid 10/14/2017   Pelvic pain in female 10/14/2017   Panic attack 06/06/2016   HSIL on Pap smear of cervix 07/25/2013   Allergic rhinitis 07/19/2013   Anxiety state 05/25/2013   Anxiety 05/25/2013   Major depressive disorder, recurrent episode, moderate (HCC) 05/25/2013   Disorder of teeth and supporting structures 02/02/2013   Attention deficit disorder 01/29/2013   Constipation, unspecified 08/04/2012   Panic disorder 07/21/2012   Tobacco use disorder 06/20/2012   Asthma 04/20/2011   Status post tubal ligation 04/20/2011    Past Surgical History:  Procedure Laterality Date   ABDOMINAL SURGERY     CESAREAN SECTION      OB History   No obstetric history on file.      Home Medications    Prior to Admission medications   Medication Sig Start Date End Date Taking? Authorizing Provider  ondansetron  (ZOFRAN ) 4 MG tablet Take 1 tablet (4 mg total) by mouth every 6 (six) hours. 12/14/23  Yes Kent Pear, NP  albuterol (PROVENTIL HFA;VENTOLIN HFA) 108 (90 Base) MCG/ACT inhaler Inhale into the lungs every 6 (six) hours as needed for wheezing or shortness of breath.    [provider]  busPIRone  (BUSPAR ) 7.5 MG tablet Take 1 tablet (7.5 mg total) by mouth 2 (two) times daily. 06/22/21   Kent Pear, NP  Cholecalciferol (VITAMIN D3) 10 MCG (400 UNIT) tablet Take 800 Units by mouth daily. 02/10/23  [provider]  DULoxetine (CYMBALTA) 20 MG capsule Take by mouth.    [provider]  fluticasone  (FLONASE ) 50 MCG/ACT nasal spray Place 2 sprays into both nostrils daily. 02/12/21   Mortenson, Ashley, MD  gabapentin (NEURONTIN) 600 MG tablet TAKE 1 TABLET BY MOUTH FOUR TIMES DAILY WITH MEALS 12/03/19   [provider]  hydrOXYzine  (ATARAX ) 25 MG tablet Take 1 tablet (25 mg total) by mouth 3 (three) times daily as needed  for anxiety. 01/18/22   Ruth Cove, MD  lidocaine  (LIDODERM ) 5 % Place 1 patch onto the skin every 12 (twelve) hours. Remove & Discard patch within 12 hours or as directed by MD 11/27/23 11/26/24  Twilla Galea, MD  medroxyPROGESTERone (DEPO-PROVERA) 150 MG/ML injection Inject into the muscle.    [provider]  omeprazole  (PRILOSEC) 20 MG capsule Take 1 capsule (20 mg total) by mouth daily. 04/25/23   Brimage, Vondra, DO  oxyCODONE -acetaminophen  (PERCOCET) 5-325 MG tablet Take 1 tablet by mouth every 4 (four) hours as needed. 11/27/23 11/26/24  Twilla Galea, MD  polyethylene glycol powder (GLYCOLAX /MIRALAX ) 17 GM/SCOOP powder Take 17 g by mouth daily. 04/25/23   Brimage, Vondra, DO  tiZANidine (ZANAFLEX) 4 MG tablet Take 4 mg by mouth 2 (two) times daily as needed.    [provider]  citalopram (CELEXA) 10 MG tablet Take 10 mg by mouth daily.  01/20/19  [provider]  topiramate (TOPAMAX) 25 MG capsule Take 25 mg by mouth 2 (two) times daily.  01/20/19  [provider]    Family History History reviewed. No pertinent family history.  Social History Social History   Tobacco Use   Smoking status: Former    Types: Cigarettes   Smokeless tobacco: Never  Vaping Use   Vaping status: Some Days  Substance Use Topics   Alcohol use: Yes   Drug use: Never     Allergies   Sulfa antibiotics, Sulfamethoxazole-trimethoprim, Ibuprofen , and Tylenol  [acetaminophen ]   Review of Systems Review of Systems  Gastrointestinal:  Positive for abdominal distention, abdominal pain, anal bleeding and nausea. Negative for vomiting.  Musculoskeletal:        Mandible pain     Physical Exam Triage Vital Signs ED Triage Vitals  Encounter Vitals Group     BP      Systolic BP Percentile      Diastolic BP Percentile      Pulse      Resp      Temp      Temp src      SpO2      Weight      Height      Head Circumference      Peak Flow      Pain Score       Pain Loc      Pain Education      Exclude from Growth Chart    No data found.  Updated Vital Signs BP 131/85 (BP Location: Right Arm)   Pulse 76   Temp 99.8 F (37.7 C) (Oral)   Resp 16   LMP 11/30/2023 (Approximate)   SpO2 96%   Visual Acuity Right Eye Distance:   Left Eye Distance:   Bilateral Distance:    Right Eye Near:   Left Eye Near:    Bilateral Near:     Physical Exam Vitals and nursing note reviewed.  Constitutional:      Appearance: Normal appearance. She is not ill-appearing.  HENT:  Head: Normocephalic and atraumatic.     Mouth/Throat:     Mouth: Mucous membranes are moist.     Pharynx: Oropharynx is clear. No oropharyngeal exudate or posterior oropharyngeal erythema.     Comments: Mandible has normal range of motion and patient's teeth meet appropriately.  No deformity, edema, or ecchymosis noted. Eyes:     Extraocular Movements: Extraocular movements intact.     Conjunctiva/sclera: Conjunctivae normal.     Pupils: Pupils are equal, round, and reactive to light.  Cardiovascular:     Rate and Rhythm: Normal rate and regular rhythm.     Pulses: Normal pulses.     Heart sounds: Normal heart sounds. No murmur heard.    No friction rub. No gallop.  Pulmonary:     Effort: Pulmonary effort is normal.     Breath sounds: Normal breath sounds. No wheezing, rhonchi or rales.  Abdominal:     General: There is distension.     Tenderness: There is abdominal tenderness. There is no guarding or rebound.  Skin:    General: Skin is warm and dry.     Capillary Refill: Capillary refill takes less than 2 seconds.  Neurological:     General: No focal deficit present.     Mental Status: She is alert and oriented to person, place, and time.      UC Treatments / Results  Labs (all labs ordered are listed, but only abnormal results are displayed) Labs Reviewed  CBC WITH DIFFERENTIAL/PLATELET - Abnormal; Notable for the following components:      Result  Value   Eosinophils Absolute 0.6 (*)    All other components within normal limits    EKG   Radiology No results found.  Procedures Procedures (including critical care time)  Medications Ordered in UC Medications  ondansetron  (ZOFRAN -ODT) disintegrating tablet 4 mg (4 mg Oral Given 12/14/23 1545)  acetaminophen  (TYLENOL ) tablet 975 mg (975 mg Oral Given 12/14/23 1544)    Initial Impression / Assessment and Plan / UC Course  I have reviewed the triage vital signs and the nursing notes.  Pertinent labs & imaging results that were available during my care of the patient were reviewed by me and considered in my medical decision making (see chart for details).   Patient is a nontoxic-appearing 38 year old female presenting for evaluation of multiple complaints to include pain in her mandible after suffering a ground-level fall yesterday, abdominal distention with nausea, and rectal bleeding.  She has a history of rectal bleeding and reports that she started bleeding 2 days ago and passed a clot this morning.  She does have a history of internal hemorrhoids.  She has not had any syncope but she is concerned about how much she has been bleeding.  She has a mildly pale appearance but no significant paleness to her conjunctiva or oral mucous membranes.  However, I will order a CBC to evaluate for any worsening of her baseline anemia.  She states that she is concerned about a possible bowel blockage though she reports that she is still having stools.  She also reports that significant nausea though she has been able to eat food and keep down fluids, though eating does make her more nauseated.  I will order 4 mg of ODT Zofran  as well as a two-view abdomen to evaluate for any signs of bowel obstruction.  Additionally, patient is requesting medication for pain.  She has documented allergies to both Tylenol  and ibuprofen , though it says to see other comments  and then secondary to patient's medical condition.   I do not find any evidence of liver disease and LFTs from 03/15/2023 are normal.  I will order 975 of Tylenol  for the patient's discomfort given that she is requesting Tylenol .  CBC shows normal red count of 4.83 and normal H&H of 12.6 and 40.0.  Platelets are also normal at 279.  Mandible x-rays independently reviewed and evaluated by me.  Impression: No evidence of fracture or dislocation.  Soft tissues are unremarkable.  Radiology overread is pending. Radiology impression states no acute findings.  2 view abdomen independently reviewed and evaluated by me.  Impression: Nonobstructive bowel gas pattern present.  Stool present in the ascending colon as well as the distal transverse and descending colon.  Radiology overread is pending. Radiology impression states negative exam.  I will discharge patient home with a prescription for Zofran  that she can use every 6 hours as needed for nausea and vomiting and have her continue her MiraLAX  to help with daily bowel movements.  If she develops any increased abdominal pain, nausea and vomiting where she cannot keep down fluids, dizziness, or syncope she needs to go to the ER for evaluation.   Final Clinical Impressions(s) / UC Diagnoses   Final diagnoses:  Mandible pain  Abdominal distention  Contusion of jaw, initial encounter  Nausea  Rectal bleeding     Discharge Instructions      Your abdominal x-rays did not show any evidence of obstruction though you do have stool in your colon.  Your blood work also did not show any evidence of anemia.  Additionally, your jaw x-rays did also did not show any evidence of fracture.  Continue to take over-the-counter Tylenol  as needed for pain.  Use the Zofran  every 6 hours as needed for nausea and vomiting.  Continue to orally rehydrate using broth, water, ginger ale.  You may slowly advance your diet as tolerated.  Continue to use your MiraLAX  daily to help prevent constipation and help you have daily  bowel movements.  You may apply ice to your chin for 20 minutes at a time, 2-3 times a day, as needed for pain and inflammation.  If you develop any increase in abdominal pain, nausea and vomiting or you cannot keep down fluids, dizziness, or have fainting spells you need to go to the emergency department for evaluation.   ED Prescriptions     Medication Sig Dispense Auth. Provider   ondansetron  (ZOFRAN ) 4 MG tablet Take 1 tablet (4 mg total) by mouth every 6 (six) hours. 12 tablet Kent Pear, NP      PDMP not reviewed this encounter.   Kent Pear, NP 12/14/23 1620    Kent Pear, NP 12/14/23 [id number]

## 2023-12-15 ENCOUNTER — Ambulatory Visit (HOSPITAL_COMMUNITY): Payer: Self-pay

## 2023-12-31 ENCOUNTER — Ambulatory Visit
Admission: EM | Admit: 2023-12-31 | Discharge: 2023-12-31 | Disposition: A | Discharge status: Home / Self Care | Attending: Physician Assistant | Admitting: Physician Assistant

## 2023-12-31 DIAGNOSIS — J45901 Unspecified asthma with (acute) exacerbation: Secondary | ICD-10-CM

## 2023-12-31 DIAGNOSIS — J209 Acute bronchitis, unspecified: Secondary | ICD-10-CM | POA: Diagnosis not present

## 2023-12-31 DIAGNOSIS — J019 Acute sinusitis, unspecified: Secondary | ICD-10-CM

## 2023-12-31 DIAGNOSIS — R051 Acute cough: Secondary | ICD-10-CM | POA: Diagnosis not present

## 2023-12-31 MED ORDER — PREDNISONE 20 MG PO TABS: 40.0000 mg | ORAL_TABLET | Freq: Every day | ORAL | 0 refills | Status: AC

## 2023-12-31 MED ORDER — IPRATROPIUM BROMIDE 0.06 % NA SOLN
2.0000 | Freq: Four times a day (QID) | NASAL | 0 refills | Status: AC
  Filled 2024-03-19: qty 15, 19d supply, fill #0

## 2023-12-31 MED ORDER — BENZONATATE 200 MG PO CAPS: 200.0000 mg | ORAL_CAPSULE | Freq: Three times a day (TID) | ORAL | 0 refills | Status: DC | PRN

## 2023-12-31 MED ORDER — DOXYCYCLINE HYCLATE 100 MG PO CAPS
100.0000 mg | ORAL_CAPSULE | Freq: Two times a day (BID) | ORAL | 0 refills | Status: AC
Start: 2023-12-31 — End: 2024-01-07

## 2023-12-31 NOTE — ED Triage Notes (Signed)
 Pt c/o congestion with yellow phlegm, right eye drainage x1-2 weeks  Pt has been using hot compresses on the eye to get rid of the drainage.  Pt states that she has coughed up large amounts of mucus.   Pt states that her body feels numb that feels like when your legs fall asleep and you're walking on them

## 2023-12-31 NOTE — ED Provider Notes (Signed)
 [facility]    CSN: [id number] Arrival date & time: 12/31/23  0919      History   Chief Complaint Chief Complaint  Patient presents with   Nasal Congestion    HPI Jasmin Robinson is a 38 y.o. female presenting for 2-week history of nasal congestion, sinus pressure, productive cough, chest discomfort and shortness of breath.  Denies recorded fever but says she has had some sweats.  Reports that she has felt more short of breath than normal and has been using inhaler more than normal.  Patient symptoms started with a sore throat but it has resolved.  She reports feeling fatigued.  States cough and nasal congestion are both productive of greenish mucus.  No other complaints.  Patient has not taken any OTC meds due to financial difficulties.    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   Iron deficiency anemia due to chronic blood loss 01/20/2023   Rectal bleeding 01/20/2023   Night sweats 12/31/2022   Polyarthralgia 12/31/2022   Diarrhea 05/28/2022   Depression 03/11/2022   Seasonal allergies 10/21/2021   Rectal prolapse 08/04/2021   Otitis externa 11/06/2020   Urinary hesitancy 11/06/2020   Fatigue 10/02/2020   Atopic dermatitis 08/28/2020   Generalized abdominal pain 07/21/2020   Mild intermittent asthma, uncomplicated 07/21/2020   Personal history of other mental and behavioral disorders 07/21/2020   Contraception management 07/21/2020   Unspecified hemorrhoids 07/21/2020   Urticaria 07/21/2020   Hyperglycemia 03/29/2019   Increased frequency of urination 03/29/2019    Overweight 03/29/2019   Back pain 03/29/2019   Fibroid 10/14/2017   Pelvic pain in female 10/14/2017   Panic attack 06/06/2016   HSIL on Pap smear of cervix 07/25/2013   Allergic rhinitis 07/19/2013   Anxiety state 05/25/2013   Anxiety 05/25/2013   Major depressive disorder, recurrent episode, moderate (HCC) 05/25/2013   Disorder of teeth and supporting structures 02/02/2013   Attention deficit disorder 01/29/2013   Constipation, unspecified 08/04/2012   Panic disorder 07/21/2012   Tobacco use disorder 06/20/2012   Asthma 04/20/2011   Status post tubal ligation 04/20/2011    Past Surgical History:  Procedure Laterality Date   ABDOMINAL SURGERY     CESAREAN SECTION      OB History   No obstetric history on file.      Home Medications    Prior to Admission medications   Medication Sig Start Date End Date Taking? Authorizing Provider  albuterol (PROVENTIL HFA;VENTOLIN HFA) 108 (90 Base) MCG/ACT inhaler Inhale into the lungs every 6 (six) hours as needed for wheezing or shortness of breath.   Yes [provider]  benzonatate (TESSALON) 200 MG capsule Take 1 capsule (200 mg total) by mouth 3 (three) times daily as needed. 12/31/23  Yes Arvis Huxley B, PA-C  busPIRone  (BUSPAR ) 7.5 MG tablet Take 1 tablet (7.5 mg total) by mouth 2 (two) times daily. 06/22/21  Yes Bernardino Ditch, NP  Cholecalciferol (VITAMIN D3) 10 MCG (400 UNIT) tablet Take 800 Units by mouth daily. 02/10/23  Yes [provider]  doxycycline  (VIBRAMYCIN ) 100 MG capsule  Take 1 capsule (100 mg total) by mouth 2 (two) times daily for 7 days. 12/31/23 01/07/24 Yes Arvis Huxley B, PA-C  DULoxetine (CYMBALTA) 20 MG capsule Take by mouth.   Yes [provider]  fluticasone  (FLONASE ) 50 MCG/ACT nasal spray Place 2 sprays into both nostrils daily. 02/12/21  Yes Mortenson, Ashley, MD  gabapentin (NEURONTIN) 600 MG tablet TAKE 1 TABLET BY MOUTH FOUR TIMES DAILY WITH MEALS 12/03/19  Yes [provider]  hydrOXYzine  (ATARAX ) 25 MG tablet Take 1 tablet (25 mg total) by mouth 3 (three) times daily as needed for anxiety. 01/18/22  Yes Dorothyann Drivers, MD  ipratropium (ATROVENT) 0.06 % nasal spray Place 2 sprays into both nostrils 4 (four) times daily. 12/31/23  Yes Arvis Huxley B, PA-C  lidocaine  (LIDODERM ) 5 % Place 1 patch onto the skin every 12 (twelve) hours. Remove & Discard patch within 12 hours or as directed by MD 11/27/23 11/26/24 Yes Willo Dunnings, MD  medroxyPROGESTERone (DEPO-PROVERA) 150 MG/ML injection Inject into the muscle.   Yes [provider]  omeprazole  (PRILOSEC) 20 MG capsule Take 1 capsule (20 mg total) by mouth daily. 04/25/23  Yes Brimage, Vondra, DO  ondansetron  (ZOFRAN ) 4 MG tablet Take 1 tablet (4 mg total) by mouth every 6 (six) hours. 12/14/23  Yes Bernardino Ditch, NP  oxyCODONE -acetaminophen  (PERCOCET) 5-325 MG tablet Take 1 tablet by mouth every 4 (four) hours as needed. 11/27/23 11/26/24 Yes Willo Dunnings, MD  polyethylene glycol powder (GLYCOLAX /MIRALAX ) 17 GM/SCOOP powder Take 17 g by mouth daily. 12/14/23  Yes Bernardino Ditch, NP  tiZANidine (ZANAFLEX) 4 MG tablet Take 4 mg by mouth 2 (two) times daily as needed.   Yes [provider]  citalopram (CELEXA) 10 MG tablet Take 10 mg by mouth daily.  01/20/19  [provider]  topiramate (TOPAMAX) 25 MG capsule Take 25 mg by mouth 2 (two) times daily.  01/20/19  [provider]    Family History History reviewed. No pertinent family history.  Social History Social History   Tobacco Use   Smoking status: Former    Types: Cigarettes   Smokeless tobacco: Never  Vaping Use   Vaping status: Some Days  Substance Use Topics   Alcohol use: Yes   Drug use: Never     Allergies   Sulfa antibiotics, Sulfamethoxazole-trimethoprim, Ibuprofen , and Tylenol  [acetaminophen ]   Review of Systems Review of Systems  Constitutional:  Positive for diaphoresis and fatigue. Negative for chills and  fever.  HENT:  Positive for congestion, rhinorrhea and sore throat. Negative for ear pain, sinus pressure and sinus pain.   Respiratory:  Positive for cough, chest tightness and shortness of breath.   Cardiovascular:  Negative for chest pain.  Gastrointestinal:  Negative for abdominal pain, nausea and vomiting.  Musculoskeletal:  Negative for arthralgias and myalgias.  Skin:  Negative for rash.  Neurological:  Negative for weakness and headaches.  Hematological:  Negative for adenopathy.     Physical Exam Triage Vital Signs ED Triage Vitals  Encounter Vitals Group     BP 12/31/23 0933 (!) 139/101     Girls Systolic BP Percentile --      Girls Diastolic BP Percentile --      Boys Systolic BP Percentile --      Boys Diastolic BP Percentile --      Pulse Rate 12/31/23 0933 (!) 59     Resp --      Temp 12/31/23 0933 99 F (37.2 C)  Temp Source 12/31/23 0933 Oral     SpO2 12/31/23 0933 100 %     Weight 12/31/23 0936 224 lb (101.6 kg)     Height 12/31/23 0936 5' 7 (1.702 m)     Head Circumference --      Peak Flow --      Pain Score 12/31/23 0934 9     Pain Loc --      Pain Education --      Exclude from Growth Chart --    No data found.  Updated Vital Signs BP (!) 139/101 (BP Location: Left Arm)   Pulse (!) 59   Temp 99 F (37.2 C) (Oral)   Ht 5' 7 (1.702 m)   Wt 224 lb (101.6 kg)   LMP 11/30/2023 (Approximate)   SpO2 100%   BMI 35.08 kg/m     Physical Exam Vitals and nursing note reviewed.  Constitutional:      General: She is not in acute distress.    Appearance: Normal appearance. She is ill-appearing. She is not toxic-appearing.  HENT:     Head: Normocephalic and atraumatic.     Right Ear: Tympanic membrane, ear canal and external ear normal.     Left Ear: Tympanic membrane, ear canal and external ear normal.     Nose: Congestion present.     Mouth/Throat:     Mouth: Mucous membranes are moist.     Pharynx: Oropharynx is clear.   Eyes:      General: No scleral icterus.       Right eye: No discharge.        Left eye: No discharge.     Conjunctiva/sclera: Conjunctivae normal.    Cardiovascular:     Rate and Rhythm: Normal rate and regular rhythm.     Heart sounds: Normal heart sounds.  Pulmonary:     Effort: Pulmonary effort is normal. No respiratory distress.     Breath sounds: Normal breath sounds.   Musculoskeletal:     Cervical back: Neck supple.   Skin:    General: Skin is dry.   Neurological:     General: No focal deficit present.     Mental Status: She is alert. Mental status is at baseline.     Motor: No weakness.     Gait: Gait normal.   Psychiatric:        Mood and Affect: Mood normal.        Behavior: Behavior normal.      UC Treatments / Results  Labs (all labs ordered are listed, but only abnormal results are displayed) Labs Reviewed - No data to display  EKG   Radiology No results found.  Procedures Procedures (including critical care time)  Medications Ordered in UC Medications - No data to display  Initial Impression / Assessment and Plan / UC Course  I have reviewed the triage vital signs and the nursing notes.  Pertinent labs & imaging results that were available during my care of the patient were reviewed by me and considered in my medical decision making (see chart for details).   38 year old female with history of asthma presents for 2-week history of nasal congestion, sinus pressure, productive cough and shortness of breath.  No associated fever but has had sweats.  Using albuterol more than normal.  She is afebrile. Ill appearing and tearful. NAD.  Has nasal congestion on exam.  Throat is clear.  Chest clear at this time but she has recently coughed a lot.  Acute sinusitis/acute bronchitis. Asthma exacerbation.  Treating at this time with doxycycline , Atrovent nasal spray and benzonatate as well as prednisone .  Continue inhalers.  Increase rest and fluids.  Reviewed return  precautions.   Final Clinical Impressions(s) / UC Diagnoses   Final diagnoses:  Acute sinusitis, recurrence not specified, unspecified location  Acute bronchitis, unspecified organism  Acute cough   Discharge Instructions   None    ED Prescriptions     Medication Sig Dispense Auth. Provider   doxycycline  (VIBRAMYCIN ) 100 MG capsule Take 1 capsule (100 mg total) by mouth 2 (two) times daily for 7 days. 14 capsule Arvis Huxley B, PA-C   ipratropium (ATROVENT) 0.06 % nasal spray Place 2 sprays into both nostrils 4 (four) times daily. 15 mL Arvis Huxley B, PA-C   benzonatate (TESSALON) 200 MG capsule Take 1 capsule (200 mg total) by mouth 3 (three) times daily as needed. 30 capsule Arvis Huxley NOVAK, PA-C      PDMP not reviewed this encounter.   Arvis Huxley NOVAK, PA-C 12/31/23 1031

## 2024-01-02 ENCOUNTER — Other Ambulatory Visit: Payer: Self-pay

## 2024-01-02 MED ORDER — GABAPENTIN 600 MG PO TABS
600.0000 mg | ORAL_TABLET | Freq: Two times a day (BID) | ORAL | 2 refills | Status: AC
  Filled 2024-01-03: qty 60, 30d supply, fill #0
  Filled 2024-01-29: qty 60, 30d supply, fill #1

## 2024-01-02 MED ORDER — KETOCONAZOLE 2 % EX SHAM
MEDICATED_SHAMPOO | CUTANEOUS | 10 refills | Status: AC
  Filled 2024-01-25: qty 120, 30d supply, fill #0
  Filled 2024-04-19: qty 120, 30d supply, fill #1

## 2024-01-02 MED ORDER — GABAPENTIN 300 MG PO CAPS
300.0000 mg | ORAL_CAPSULE | Freq: Two times a day (BID) | ORAL | 1 refills | Status: AC
  Filled 2024-03-10: qty 90, 30d supply, fill #0

## 2024-01-02 MED ORDER — MELOXICAM 15 MG PO TABS
15.0000 mg | ORAL_TABLET | Freq: Every day | ORAL | 1 refills | Status: AC
  Filled 2024-01-03: qty 30, 30d supply, fill #0
  Filled 2024-01-29: qty 30, 30d supply, fill #1

## 2024-01-02 MED ORDER — SIMETHICONE 125 MG PO CAPS
125.0000 mg | ORAL_CAPSULE | Freq: Three times a day (TID) | ORAL | 3 refills | Status: AC
  Filled 2024-01-25 – 2024-03-19 (×6): qty 90, 30d supply, fill #0
  Filled 2024-03-19: qty 60, 20d supply, fill #0
  Filled 2024-03-19 – 2024-05-23 (×3): qty 90, 30d supply, fill #0
  Filled 2024-06-18 – 2024-08-12 (×4): qty 90, 30d supply, fill #1

## 2024-01-02 MED ORDER — TIZANIDINE HCL 4 MG PO TABS
4.0000 mg | ORAL_TABLET | Freq: Two times a day (BID) | ORAL | 2 refills | Status: DC | PRN
  Filled 2024-01-03: qty 60, 30d supply, fill #0
  Filled 2024-02-01: qty 60, 30d supply, fill #1

## 2024-01-02 MED ORDER — ACETAMINOPHEN 500 MG PO TABS
1000.0000 mg | ORAL_TABLET | Freq: Four times a day (QID) | ORAL | 2 refills | Status: AC | PRN
  Filled 2024-03-10 – 2024-03-19 (×2): qty 100, 13d supply, fill #0
  Filled 2024-05-16 (×6): qty 100, 13d supply, fill #1
  Filled 2024-05-23: qty 100, 13d supply, fill #2

## 2024-01-02 MED ORDER — FAMOTIDINE 20 MG PO TABS
20.0000 mg | ORAL_TABLET | Freq: Two times a day (BID) | ORAL | 0 refills | Status: AC
  Filled 2024-01-03: qty 30, 15d supply, fill #0

## 2024-01-02 MED ORDER — MELOXICAM 7.5 MG PO TABS
7.5000 mg | ORAL_TABLET | Freq: Every day | ORAL | 1 refills | Status: AC
  Filled 2024-01-25: qty 30, 30d supply, fill #0

## 2024-01-02 MED ORDER — HYDROCORTISONE 2.5 % EX OINT
TOPICAL_OINTMENT | Freq: Two times a day (BID) | CUTANEOUS | 11 refills | Status: AC
  Filled 2024-01-25: qty 20, 15d supply, fill #0
  Filled 2024-03-10: qty 20, 15d supply, fill #1
  Filled 2024-04-19: qty 20, 15d supply, fill #2

## 2024-01-02 MED ORDER — IBUPROFEN 600 MG PO TABS
600.0000 mg | ORAL_TABLET | Freq: Four times a day (QID) | ORAL | 2 refills | Status: AC | PRN
  Filled 2024-05-16 (×6): qty 60, 15d supply, fill #0
  Filled 2024-05-23 – 2024-05-27 (×2): qty 60, 15d supply, fill #1

## 2024-01-02 MED ORDER — DULOXETINE HCL 30 MG PO CPEP
30.0000 mg | ORAL_CAPSULE | ORAL | 2 refills | Status: AC
  Filled 2024-01-25: qty 60, 30d supply, fill #0
  Filled 2024-04-19: qty 60, 30d supply, fill #1

## 2024-01-02 MED ORDER — GABAPENTIN 300 MG PO CAPS
300.0000 mg | ORAL_CAPSULE | Freq: Three times a day (TID) | ORAL | 3 refills | Status: AC
  Filled 2024-01-25: qty 90, 30d supply, fill #0
  Filled 2024-04-19: qty 90, 30d supply, fill #1
  Filled 2024-05-23: qty 90, 30d supply, fill #2

## 2024-01-02 MED ORDER — DULOXETINE HCL 60 MG PO CPEP
60.0000 mg | ORAL_CAPSULE | Freq: Every day | ORAL | 1 refills | Status: DC
  Filled 2024-01-25 – 2024-01-29 (×2): qty 30, 30d supply, fill #0

## 2024-01-02 MED ORDER — NORETHINDRONE 0.35 MG PO TABS
1.0000 | ORAL_TABLET | Freq: Every day | ORAL | 3 refills | Status: DC
  Filled 2024-03-10: qty 84, 84d supply, fill #0
  Filled 2024-05-16 (×2): qty 84, 84d supply, fill #1

## 2024-01-03 ENCOUNTER — Other Ambulatory Visit: Payer: Self-pay

## 2024-01-04 ENCOUNTER — Emergency Department
Admission: EM | Admit: 2024-01-04 | Discharge: 2024-01-04 | Disposition: A | Discharge status: Home / Self Care | Attending: Emergency Medicine | Admitting: Emergency Medicine

## 2024-01-04 ENCOUNTER — Other Ambulatory Visit: Payer: Self-pay

## 2024-01-04 ENCOUNTER — Emergency Department

## 2024-01-04 DIAGNOSIS — K529 Noninfective gastroenteritis and colitis, unspecified: Secondary | ICD-10-CM | POA: Diagnosis not present

## 2024-01-04 DIAGNOSIS — J45909 Unspecified asthma, uncomplicated: Secondary | ICD-10-CM | POA: Diagnosis not present

## 2024-01-04 DIAGNOSIS — R1084 Generalized abdominal pain: Secondary | ICD-10-CM | POA: Diagnosis present

## 2024-01-04 LAB — COMPREHENSIVE METABOLIC PANEL WITH GFR
ALT: 12 U/L (ref 0–44)
AST: 16 U/L (ref 15–41)
Albumin: 3.9 g/dL (ref 3.5–5.0)
Alkaline Phosphatase: 65 U/L (ref 38–126)
Anion gap: 9 (ref 5–15)
BUN: 22 mg/dL — ABNORMAL HIGH (ref 6–20)
CO2: 24 mmol/L (ref 22–32)
Calcium: 9.2 mg/dL (ref 8.9–10.3)
Chloride: 105 mmol/L (ref 98–111)
Creatinine, Ser: 0.77 mg/dL (ref 0.44–1.00)
GFR, Estimated: >60 mL/min (ref >60)
Glucose, Bld: 96 mg/dL (ref 70–99)
Potassium: 3.4 mmol/L — ABNORMAL LOW (ref 3.5–5.1)
Sodium: 138 mmol/L (ref 135–145)
Total Bilirubin: 0.7 mg/dL (ref 0.0–1.2)
Total Protein: 7.3 g/dL (ref 6.5–8.1)

## 2024-01-04 LAB — CBC WITH DIFFERENTIAL/PLATELET
Abs Immature Granulocytes: 0.04 10*3/uL (ref 0.00–0.07)
Basophils Absolute: 0.1 10*3/uL (ref 0.0–0.1)
Basophils Relative: 1 %
Eosinophils Absolute: 0.4 10*3/uL (ref 0.0–0.5)
Eosinophils Relative: 4 %
HCT: 37.3 % (ref 36.0–46.0)
Hemoglobin: 11.6 g/dL — ABNORMAL LOW (ref 12.0–15.0)
Immature Granulocytes: 0 %
Lymphocytes Relative: 24 %
Lymphs Abs: 2.5 10*3/uL (ref 0.7–4.0)
MCH: 25.8 pg — ABNORMAL LOW (ref 26.0–34.0)
MCHC: 31.1 g/dL (ref 30.0–36.0)
MCV: 82.9 fL (ref 80.0–100.0)
Monocytes Absolute: 0.9 10*3/uL (ref 0.1–1.0)
Monocytes Relative: 9 %
Neutro Abs: 6.4 10*3/uL (ref 1.7–7.7)
Neutrophils Relative %: 62 %
Platelets: 278 10*3/uL (ref 150–400)
RBC: 4.5 MIL/uL (ref 3.87–5.11)
RDW: 14.6 % (ref 11.5–15.5)
WBC: 10.3 10*3/uL (ref 4.0–10.5)
nRBC: 0 % (ref 0.0–0.2)

## 2024-01-04 LAB — URINALYSIS, ROUTINE W REFLEX MICROSCOPIC
Bilirubin Urine: NEGATIVE
Glucose, UA: NEGATIVE mg/dL
Hgb urine dipstick: NEGATIVE
Ketones, ur: NEGATIVE mg/dL
Leukocytes,Ua: NEGATIVE
Nitrite: NEGATIVE
Protein, ur: NEGATIVE mg/dL
Specific Gravity, Urine: 1.024 (ref 1.005–1.030)
pH: 7 (ref 5.0–8.0)

## 2024-01-04 LAB — HCG, QUANTITATIVE, PREGNANCY: hCG, Beta Chain, Quant, S: <1 m[IU]/mL (ref <5)

## 2024-01-04 LAB — LIPASE, BLOOD: Lipase: 26 U/L (ref 11–51)

## 2024-01-04 MED ORDER — FAMOTIDINE IN NACL 20-0.9 MG/50ML-% IV SOLN
20.0000 mg | Freq: Once | INTRAVENOUS | Status: AC
  Administered 2024-01-04: 20 mg via INTRAVENOUS
  Filled 2024-01-04: qty 50

## 2024-01-04 MED ORDER — FLUTICASONE PROPIONATE 50 MCG/ACT NA SUSP
2.0000 | Freq: Every day | NASAL | 0 refills | Status: AC
  Filled 2024-01-04: qty 16, 30d supply, fill #0

## 2024-01-04 MED ORDER — MORPHINE SULFATE (PF) 4 MG/ML IV SOLN
4.0000 mg | Freq: Once | INTRAVENOUS | Status: AC
  Administered 2024-01-04: 4 mg via INTRAVENOUS
  Filled 2024-01-04: qty 1

## 2024-01-04 MED ORDER — IOHEXOL 300 MG/ML  SOLN
100.0000 mL | Freq: Once | INTRAMUSCULAR | Status: AC | PRN
  Administered 2024-01-04: 100 mL via INTRAVENOUS

## 2024-01-04 MED ORDER — SENNOSIDES-DOCUSATE SODIUM 8.6-50 MG PO TABS
1.0000 | ORAL_TABLET | Freq: Every day | ORAL | 0 refills | Status: AC
  Filled 2024-01-04: qty 30, 30d supply, fill #0

## 2024-01-04 MED ORDER — BENZONATATE 200 MG PO CAPS
200.0000 mg | ORAL_CAPSULE | Freq: Three times a day (TID) | ORAL | 0 refills | Status: DC | PRN
  Filled 2024-01-04: qty 30, 10d supply, fill #0

## 2024-01-04 MED ORDER — ONDANSETRON HCL 4 MG/2ML IJ SOLN
4.0000 mg | Freq: Once | INTRAMUSCULAR | Status: AC
  Administered 2024-01-04: 4 mg via INTRAVENOUS
  Filled 2024-01-04: qty 2

## 2024-01-04 NOTE — ED Notes (Signed)
 Labs sent down with save tube label

## 2024-01-04 NOTE — ED Notes (Signed)
 CT notified of negative pregnancy lab test.

## 2024-01-04 NOTE — ED Notes (Signed)
 Fall risk implementation d/t meds given.

## 2024-01-04 NOTE — Discharge Instructions (Signed)
 You have inflammation of your colon secondary to constipation.  I have sent a medication to help with your constipation symptoms.  Please take this as prescribed and follow-up with your primary care provider.

## 2024-01-04 NOTE — ED Triage Notes (Signed)
 Pt arrives via EMS from home  C/o breakthrough abdominal pain c/o of tearing abd pain c/o throwing up straight acid MedHx of Kidney issues  Scheduled to have abd surgery at [facility] 01/09/2024 EMS interventions: 100mvg fentanyl, 4 Zofran , 300 mL Normal Saline #22 right hand  EMS vitals: CO2= 21, RR: 32, 93/68, HR 60, 99% RA, CBG- none, no temp  EMS assessment: doubled over in pain, N/V Allergic to sulfa drugs  Pt states she's taken laxatives and her BMs look very dark

## 2024-01-04 NOTE — ED Notes (Signed)
 Pt telling this RN that she doesn't have means to get home. This RN will speak w/charge RN.

## 2024-01-04 NOTE — ED Notes (Signed)
 This RN attempted to get urine sample from Pt, she is unable to provide one after 1 attempt. This RN will try again.

## 2024-01-04 NOTE — ED Notes (Signed)
 Fall precautions in place due to pt being high fall risk.

## 2024-01-04 NOTE — ED Notes (Signed)
 Pt walking and independent but when told she was d/c starting stating she can't walking and walking wobbly.

## 2024-01-04 NOTE — ED Notes (Signed)
 Pt to CT

## 2024-01-04 NOTE — ED Notes (Signed)
 Pt d/c to lobby offered bus pass if needed per charge RN. Pt visibly upset and crying as she doesn't want a bus pass, she wanted a taxi. Asked if she could order her own uber and she said no, she wanted a taxi from us  as she's gotten it in the past. Pt on phone w/family member and placed in waiting area, First nurse made aware.

## 2024-01-04 NOTE — ED Provider Notes (Signed)
 [facility] Provider Note    Event Date/Time   First MD Initiated Contact with Patient 01/04/24 [phone]     (approximate)   History   Abdominal Pain   HPI Jasmin Robinson is a 38 y.o. female with history of anxiety, asthma, MDD, fibromyalgia presenting today for abdominal pain.  Patient states she has been dealing with sinusitis symptoms over the past 3 weeks.  Was originally seen and started on medications but she has not been able to pick up all of them.  She has had ongoing congestion and nasal drainage.  She reports overnight she developed sharp generalized abdominal pain that then located to her right lower quadrant.  Multiple episodes of nausea with vomiting and having constipation for a while as well with prior history of the same.  Denies any fevers, chest pain, difficulty breathing.  No dysuria or vaginal symptoms.  Chart review: Reviewed outpatient notes with her gynecologist documenting chronic fibromyalgia and chronic pain symptoms     Physical Exam   Triage Vital Signs: ED Triage Vitals  Encounter Vitals Group     BP 01/04/24 0329 (!) 130/101     Girls Systolic BP Percentile --      Girls Diastolic BP Percentile --      Boys Systolic BP Percentile --      Boys Diastolic BP Percentile --      Pulse Rate 01/04/24 0329 (!) 53     Resp 01/04/24 0334 18     Temp 01/04/24 0329 98 F (36.7 C)     Temp Source 01/04/24 0329 Oral     SpO2 01/04/24 0326 98 %     Weight 01/04/24 0330 225 lb 8 oz (102.3 kg)     Height 01/04/24 0330 5' 7 (1.702 m)     Head Circumference --      Peak Flow --      Pain Score 01/04/24 0329 10     Pain Loc --      Pain Education --      Exclude from Growth Chart --     Most recent vital signs: Vitals:   01/04/24 0334 01/04/24 0440  BP:  (!) 140/72  Pulse:  60  Resp: 18   Temp:    SpO2:  98%   Physical Exam: I have reviewed the vital signs and nursing notes. General: Awake, alert, no acute distress.  Nontoxic  appearing. Head:  Atraumatic, normocephalic.   ENT:  EOM intact, PERRL. Oral mucosa is pink and moist with no lesions. Neck: Neck is supple with full range of motion, No meningeal signs. Cardiovascular:  RRR, No murmurs. Peripheral pulses palpable and equal bilaterally. Respiratory:  Symmetrical chest wall expansion.  No rhonchi, rales, or wheezes.  Good air movement throughout.  No use of accessory muscles.   Musculoskeletal:  No cyanosis or edema. Moving extremities with full ROM Abdomen:  Soft, mild generalized tenderness palpation which appears more prominent in the right lower quadrant, nondistended. Neuro:  GCS 15, moving all four extremities, interacting appropriately. Speech clear. Psych:  Calm, appropriate.   Skin:  Warm, dry, no rash.    ED Results / Procedures / Treatments   Labs (all labs ordered are listed, but only abnormal results are displayed) Labs Reviewed  URINALYSIS, ROUTINE W REFLEX MICROSCOPIC - Abnormal; Notable for the following components:      Result Value   Color, Urine YELLOW (*)    APPearance CLEAR (*)    All other components within  normal limits  CBC WITH DIFFERENTIAL/PLATELET - Abnormal; Notable for the following components:   Hemoglobin 11.6 (*)    MCH 25.8 (*)    All other components within normal limits  COMPREHENSIVE METABOLIC PANEL WITH GFR - Abnormal; Notable for the following components:   Potassium 3.4 (*)    BUN 22 (*)    All other components within normal limits  LIPASE, BLOOD  HCG, QUANTITATIVE, PREGNANCY     EKG    RADIOLOGY Independently interpreted CT abdomen/pelvis showing evidence of colitis and constipation   PROCEDURES:  Critical Care performed: No  Procedures   MEDICATIONS ORDERED IN ED: Medications  famotidine (PEPCID) IVPB 20 mg premix (0 mg Intravenous Stopped 01/04/24 0421)  morphine  (PF) 4 MG/ML injection 4 mg (4 mg Intravenous Given 01/04/24 0402)  ondansetron  (ZOFRAN ) injection 4 mg (4 mg Intravenous Given  01/04/24 0426)  iohexol  (OMNIPAQUE ) 300 MG/ML solution 100 mL (100 mLs Intravenous Contrast Given 01/04/24 0446)     IMPRESSION / MDM / ASSESSMENT AND PLAN / ED COURSE  I reviewed the triage vital signs and the nursing notes.                              Differential diagnosis includes, but is not limited to, colitis, enteritis, constipation, appendicitis, diverticulitis, SBO  Patient's presentation is most consistent with acute complicated illness / injury requiring diagnostic workup.  Patient is a 38 year old female presenting today for generalized abdominal pain worse in the right lower quadrant as well as nausea with vomiting.  On arrival, she does not appear in any particular distress and has completely unremarkable vital signs.  Overall her abdominal exam is very benign but she does note some tenderness in the right side.  Will collect laboratory workup with CBC, CMP, lipase, UA, and CT abdomen/pelvis.  Given Pepcid and morphine  for symptomatic treatment as she already received Zofran  with EMS.  Laboratory workup entirely unremarkable.  UA without signs of infection.  CT abdomen/pelvis shows evidence of colitis with constipation.  Patient was feeling better after reassessment.  Suspect symptoms are related to colitis and constipation which she did have a bowel movement here with improvement.  Will discharge with Peri-Colace as well as represcribe her urgent care prescriptions from a couple days ago for Flonase  and Tessalon Perles that she was unable to pick those up.  Follow-up with PCP and given strict return precautions.  The patient is on the cardiac monitor to evaluate for evidence of arrhythmia and/or significant heart rate changes. Clinical Course as of 01/04/24 0558  Wed Jan 04, 2024  0433 Ambulating around the ED with no acute distress [DW]  0521 CT ABDOMEN PELVIS W CONTRAST Colitis with constipation [DW]    Clinical Course User Index [DW] Malvina Alm DASEN, MD     FINAL  CLINICAL IMPRESSION(S) / ED DIAGNOSES   Final diagnoses:  Colitis     Rx / DC Orders   ED Discharge Orders          Ordered    senna-docusate (SENOKOT-S) 8.6-50 MG tablet  Daily        01/04/24 0556    benzonatate (TESSALON) 200 MG capsule  3 times daily PRN        01/04/24 0556    fluticasone  (FLONASE ) 50 MCG/ACT nasal spray  Daily        01/04/24 0556             Note:  This  document was prepared using Conservation officer, historic buildings and may include unintentional dictation errors.   Malvina Alm DASEN, MD 01/04/24 [phone]

## 2024-01-25 ENCOUNTER — Other Ambulatory Visit (HOSPITAL_COMMUNITY): Payer: Self-pay

## 2024-01-25 ENCOUNTER — Other Ambulatory Visit: Payer: Self-pay

## 2024-01-30 ENCOUNTER — Other Ambulatory Visit (HOSPITAL_COMMUNITY): Payer: Self-pay

## 2024-01-30 ENCOUNTER — Other Ambulatory Visit: Payer: Self-pay

## 2024-02-01 ENCOUNTER — Other Ambulatory Visit: Payer: Self-pay

## 2024-02-13 ENCOUNTER — Other Ambulatory Visit: Payer: Self-pay

## 2024-02-13 ENCOUNTER — Other Ambulatory Visit (HOSPITAL_COMMUNITY): Payer: Self-pay

## 2024-02-15 ENCOUNTER — Other Ambulatory Visit (HOSPITAL_COMMUNITY): Payer: Self-pay

## 2024-02-15 ENCOUNTER — Other Ambulatory Visit: Payer: Self-pay

## 2024-02-16 ENCOUNTER — Other Ambulatory Visit: Payer: Self-pay

## 2024-02-16 ENCOUNTER — Encounter: Payer: Self-pay | Admitting: Pharmacist

## 2024-02-21 ENCOUNTER — Other Ambulatory Visit: Payer: Self-pay

## 2024-03-03 ENCOUNTER — Ambulatory Visit
Admission: RE | Admit: 2024-03-03 | Discharge: 2024-03-03 | Disposition: A | Discharge status: Home / Self Care | Source: Ambulatory Visit | Attending: Family Medicine | Admitting: Family Medicine

## 2024-03-03 VITALS — BP 129/90 | HR 73 | Temp 98.5°F | Resp 15 | Ht 67.0 in | Wt 225.5 lb

## 2024-03-03 DIAGNOSIS — U071 COVID-19: Secondary | ICD-10-CM

## 2024-03-03 LAB — GROUP A STREP BY PCR: Group A Strep by PCR: NOT DETECTED

## 2024-03-03 LAB — SARS CORONAVIRUS 2 BY RT PCR: SARS Coronavirus 2 by RT PCR: POSITIVE — AB

## 2024-03-03 MED ORDER — ALBUTEROL SULFATE HFA 108 (90 BASE) MCG/ACT IN AERS
2.0000 | INHALATION_SPRAY | RESPIRATORY_TRACT | 0 refills | Status: AC | PRN
Start: 2024-03-03 — End: ?

## 2024-03-03 MED ORDER — PROMETHAZINE-DM 6.25-15 MG/5ML PO SYRP: 2.5000 mL | ORAL_SOLUTION | Freq: Four times a day (QID) | ORAL | 0 refills | Status: AC | PRN

## 2024-03-03 NOTE — Discharge Instructions (Signed)

## 2024-03-03 NOTE — ED Provider Notes (Signed)
 [facility]    CSN: [id number] Arrival date & time: 03/03/24  1231      History   Chief Complaint Chief Complaint  Patient presents with   Sore Throat    HPI Finlee Concepcion is a 38 y.o. female.   HPI  History obtained from the patient. Elanore presents for nasal congestion, sore throat, eyelid discomfort, fatigue, cough, body aches and chills that started 4 days ago.   No known sick contacts.     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   Iron deficiency anemia due to chronic blood loss 01/20/2023   Rectal bleeding 01/20/2023   Night sweats 12/31/2022   Polyarthralgia 12/31/2022   Diarrhea 05/28/2022   Depression 03/11/2022   Seasonal allergies 10/21/2021   Rectal prolapse 08/04/2021   Otitis externa 11/06/2020   Urinary hesitancy 11/06/2020   Fatigue 10/02/2020   Atopic dermatitis 08/28/2020   Generalized abdominal pain 07/21/2020   Mild intermittent asthma, uncomplicated 07/21/2020   Personal history of other mental and behavioral disorders 07/21/2020   Contraception management 07/21/2020   Unspecified hemorrhoids 07/21/2020   Urticaria 07/21/2020   Hyperglycemia 03/29/2019   Increased frequency of urination 03/29/2019   Overweight 03/29/2019   Back pain 03/29/2019   Fibroid 10/14/2017   Pelvic pain in female 10/14/2017   Panic attack 06/06/2016   HSIL on Pap smear of cervix 07/25/2013   Allergic rhinitis 07/19/2013   Anxiety state 05/25/2013   Anxiety 05/25/2013   Major depressive disorder, recurrent episode, moderate (HCC) 05/25/2013   Disorder of teeth  and supporting structures 02/02/2013   Attention deficit disorder 01/29/2013   Constipation, unspecified 08/04/2012   Panic disorder 07/21/2012   Tobacco use disorder 06/20/2012   Asthma 04/20/2011   Status post tubal ligation 04/20/2011    Past Surgical History:  Procedure Laterality Date   ABDOMINAL SURGERY     CESAREAN SECTION      OB History   No obstetric history on file.      Home Medications    Prior to Admission medications   Medication Sig Start Date End Date Taking? Authorizing Provider  promethazine -dextromethorphan (PROMETHAZINE -DM) 6.25-15 MG/5ML syrup Take 2.5 mLs by mouth 4 (four) times daily as needed. 03/03/24  Yes Breyer Tejera, DO  acetaminophen  (TYLENOL ) 500 MG tablet Take 2 tablets (1,000 mg total) by mouth every 6 (six) hours as needed. 07/07/23   Tholemeier, Tinnie SAILOR, MD  albuterol  (VENTOLIN  HFA) 108 (90 Base) MCG/ACT inhaler Inhale 2 puffs into the lungs every 4 (four) hours as needed for wheezing or shortness of breath. 03/03/24   Marquette Piontek, DO  benzonatate  (TESSALON ) 200 MG capsule Take 1 capsule (200 mg total) by mouth 3 (three) times daily as needed. 01/04/24   Malvina Alm DASEN, MD  busPIRone  (BUSPAR ) 7.5 MG tablet Take 1 tablet (7.5 mg total) by mouth 2 (two) times daily. 06/22/21   Bernardino Ditch, NP  Cholecalciferol (VITAMIN D3) 10 MCG (400 UNIT) tablet Take 800 Units by mouth daily. 02/10/23   [provider]  DULoxetine  (CYMBALTA ) 20 MG capsule Take by mouth.    [provider]  DULoxetine  (CYMBALTA ) 30 MG capsule Take 1 capsule (30 mg total) by mouth daily for 1 week then increase to 1 capsule twice daily thereafter. 10/12/23     DULoxetine  (CYMBALTA ) 60 MG capsule Take 1 capsule (60 mg total) by mouth daily. 12/25/23     famotidine  (PEPCID ) 20 MG tablet Take 1 tablet (20 mg total) by mouth 2 (two) times daily. 03/24/23     fluticasone  (FLONASE ) 50 MCG/ACT nasal spray Place 2 sprays into both nostrils daily. 01/04/24   Malvina Alm DASEN,  MD  gabapentin  (NEURONTIN ) 300 MG capsule Take 1 capsule (300 mg total) by mouth 2-3 times daily. 03/30/23     gabapentin  (NEURONTIN ) 300 MG capsule Take 1 capsule (300 mg total) by mouth 3 (three) times daily. 08/23/23     gabapentin  (NEURONTIN ) 600 MG tablet TAKE 1 TABLET BY MOUTH FOUR TIMES DAILY WITH MEALS 12/03/19   [provider]  gabapentin  (NEURONTIN ) 600 MG tablet Take 1 tablet (600 mg total) by mouth 2 (two) times daily with a meal. 03/01/23     hydrocortisone  2.5 % ointment Apply topically to affected area 2 (two) times daily. 04/28/23   Darrel Ozell RAMAN, MD  hydrOXYzine  (ATARAX ) 25 MG tablet Take 1 tablet (25 mg total) by mouth 3 (three) times daily as needed for anxiety. 01/18/22   Dorothyann Drivers, MD  ibuprofen  (ADVIL ) 600 MG tablet Take 1 tablet (600 mg total) by mouth every 6 (six) hours as needed for pain. 07/07/23     ipratropium (ATROVENT ) 0.06 % nasal spray Place 2 sprays into both nostrils 4 (four) times daily. 12/31/23   Arvis Jolan NOVAK, PA-C  ketoconazole  (NIZORAL ) 2 % shampoo Apply topically twice weekly. 05/19/23   Darrel Ozell RAMAN, MD  lidocaine  (LIDODERM ) 5 % Place 1 patch onto the skin every 12 (twelve) hours. Remove & Discard patch within 12 hours or as directed by MD 11/27/23 11/26/24  Willo Dunnings, MD  medroxyPROGESTERone (DEPO-PROVERA) 150 MG/ML injection Inject into the muscle.    [provider]  meloxicam  (MOBIC ) 15 MG tablet Take 1 tablet (15 mg total) by mouth daily as needed with a meal. 10/12/23     meloxicam  (MOBIC ) 7.5 MG tablet Take 1 tablet (7.5 mg total) by mouth daily as needed for joint pain. 11/02/23   Alazzam, Hanan, PA  norethindrone  (MICRONOR ) 0.35 MG tablet Take 1 tablet (0.35 mg total) by mouth daily. 12/22/23   Rogelio Planas, MD  omeprazole  (PRILOSEC) 20 MG capsule Take 1 capsule (20 mg total) by mouth daily. 04/25/23   Kiyaan Haq, DO  ondansetron  (ZOFRAN ) 4 MG tablet Take 1 tablet (4 mg total) by mouth every 6 (six) hours.  12/14/23   Bernardino Ditch, NP  oxyCODONE -acetaminophen  (PERCOCET) 5-325 MG tablet Take 1 tablet by mouth every 4 (four) hours as needed. 11/27/23 11/26/24  Willo Dunnings, MD  polyethylene glycol powder (GLYCOLAX /MIRALAX ) 17 GM/SCOOP powder Mix 17 g in liquid and drink mixture once daily. 12/14/23   Bernardino Ditch, NP  senna-docusate (SENOKOT-S) 8.6-50 MG tablet Take 1 tablet by mouth daily. 01/04/24   Malvina Alm DASEN, MD  Simethicone  125 MG CAPS Take 1 capsule (125 mg total) by mouth 3 (three) times daily with meals. 09/14/23   Dallis Edsel HERO, PA  tiZANidine  (ZANAFLEX ) 4 MG tablet Take 4 mg by mouth 2 (two) times daily as needed.    [provider]  tiZANidine  (ZANAFLEX ) 4 MG tablet Take 1 tablet (4 mg total) by mouth 2 (two) times daily as  needed. 11/29/23     citalopram (CELEXA) 10 MG tablet Take 10 mg by mouth daily.  01/20/19  [provider]  topiramate (TOPAMAX) 25 MG capsule Take 25 mg by mouth 2 (two) times daily.  01/20/19  [provider]    Family History History reviewed. No pertinent family history.  Social History Social History   Tobacco Use   Smoking status: Former    Types: Cigarettes   Smokeless tobacco: Never  Vaping Use   Vaping status: Some Days  Substance Use Topics   Alcohol use: Yes   Drug use: Never     Allergies   Sulfa antibiotics, Sulfamethoxazole-trimethoprim, Ibuprofen , and Tylenol  [acetaminophen ]   Review of Systems Review of Systems: negative unless otherwise stated in HPI.      Physical Exam Triage Vital Signs ED Triage Vitals  Encounter Vitals Group     BP      Girls Systolic BP Percentile      Girls Diastolic BP Percentile      Boys Systolic BP Percentile      Boys Diastolic BP Percentile      Pulse      Resp      Temp      Temp src      SpO2      Weight      Height      Head Circumference      Peak Flow      Pain Score      Pain Loc      Pain Education      Exclude from Growth Chart    No data  found.  Updated Vital Signs BP (!) 129/90 (BP Location: Right Arm)   Pulse 73   Temp 98.5 F (36.9 C) (Oral)   Resp 15   Ht 5' 7 (1.702 m)   Wt 102.3 kg   SpO2 96%   BMI 35.32 kg/m   Visual Acuity Right Eye Distance:   Left Eye Distance:   Bilateral Distance:    Right Eye Near:   Left Eye Near:    Bilateral Near:     Physical Exam GEN:     alert, non-toxic appearing female in no distress    HENT:  mucus membranes moist, oropharyngeal without lesions, mild erythema, no tonsillar hypertrophy or exudates, clear nasal discharge EYES:   no scleral injection or discharge NECK:  normal ROM, no meningismus   RESP:  no increased work of breathing, clear to auscultation bilaterally CVS:   regular rate and rhythm Skin:   warm and dry    UC Treatments / Results  Labs (all labs ordered are listed, but only abnormal results are displayed) Labs Reviewed  SARS CORONAVIRUS 2 BY RT PCR - Abnormal; Notable for the following components:      Result Value   SARS Coronavirus 2 by RT PCR POSITIVE (*)    All other components within normal limits  GROUP A STREP BY PCR    EKG   Radiology No results found.  Procedures Procedures (including critical care time)  Medications Ordered in UC Medications - No data to display  Initial Impression / Assessment and Plan / UC Course  I have reviewed the triage vital signs and the nursing notes.  Pertinent labs & imaging results that were available during my care of the patient were reviewed by me and considered in my medical decision making (see chart for details).       Pt is a 38 y.o.  female who presents for 4 days of respiratory symptoms. Araina is afebrile here without recent antipyretics. Satting well on room air. Overall pt is non-toxic appearing, well hydrated, without respiratory distress. Pulmonary exam is unremarkable. Strep PCR negative. COVID obtained and was positive.  Patient declining antiviral therapy.  Promethazine  DM for  cough.  Albuterol  inhaler refilled. Discussed symptomatic treatment.  Explained lack of efficacy of antibiotics in viral disease.  Typical duration of symptoms discussed.   Return and ED precautions given and voiced understanding. Discussed MDM, treatment plan and plan for follow-up with patient who agrees with plan.     Final Clinical Impressions(s) / UC Diagnoses   Final diagnoses:  COVID-19     Discharge Instructions      Your test for COVID-19 was positive, meaning that you were infected with the novel coronavirus and could give the germ to others.  The recommendations suggest returning to normal activities when, for at least 24 hours, symptoms are improving overall, and if a fever was present, it has been gone without use of a fever-reducing medication.  You should wear a mask for the next 5 days to prevent the spread of disease. Please continue good preventive care measures, including:  frequent hand-washing, avoid touching your face, cover coughs/sneezes, stay out of crowds and keep a 6 foot distance from others.  Go to the nearest hospital emergency room if fever/cough/breathlessness are severe or illness seems like a threat to life.  If your were prescribed medication. Stop by the pharmacy to pick it up. You can take Tylenol  and/or Ibuprofen  as needed for fever reduction and pain relief.    For cough: honey 1/2 to 1 teaspoon (you can dilute the honey in water or another fluid).  You can also use guaifenesin and dextromethorphan for cough. You can use a humidifier for chest congestion and cough.  If you don't have a humidifier, you can sit in the bathroom with the hot shower running.      For sore throat: try warm salt water gargles, Mucinex sore throat cough drops or cepacol lozenges, throat spray, warm tea or water with lemon/honey, popsicles or ice, or OTC cold relief medicine for throat discomfort. You can also purchase chloraseptic spray at the pharmacy or dollar store.   For  congestion: take a daily anti-histamine like Zyrtec, Claritin, and a oral decongestant, such as pseudoephedrine.  You can also use Flonase  1-2 sprays in each nostril daily. Afrin is also a good option, if you do not have high blood pressure.    It is important to stay hydrated: drink plenty of fluids (water, gatorade/powerade/pedialyte, juices, or teas) to keep your throat moisturized and help further relieve irritation/discomfort.    Return or go to the Emergency Department if symptoms worsen or do not improve in the next few days      ED Prescriptions     Medication Sig Dispense Auth. Provider   albuterol  (VENTOLIN  HFA) 108 (90 Base) MCG/ACT inhaler Inhale 2 puffs into the lungs every 4 (four) hours as needed for wheezing or shortness of breath. 6.7 g Sangita Zani, DO   promethazine -dextromethorphan (PROMETHAZINE -DM) 6.25-15 MG/5ML syrup Take 2.5 mLs by mouth 4 (four) times daily as needed. 118 mL Hao Dion, DO      PDMP not reviewed this encounter.   Avaiyah Strubel, DO 03/03/24 1332

## 2024-03-03 NOTE — ED Triage Notes (Signed)
 Patient c/o sore throat, congestion that started 4 days ago.  Patient unsure of fevers.

## 2024-03-06 ENCOUNTER — Other Ambulatory Visit (HOSPITAL_COMMUNITY): Payer: Self-pay

## 2024-03-10 ENCOUNTER — Other Ambulatory Visit (HOSPITAL_COMMUNITY): Payer: Self-pay

## 2024-03-12 ENCOUNTER — Other Ambulatory Visit: Payer: Self-pay

## 2024-03-13 ENCOUNTER — Other Ambulatory Visit: Payer: Self-pay

## 2024-03-16 ENCOUNTER — Other Ambulatory Visit (HOSPITAL_COMMUNITY): Payer: Self-pay

## 2024-03-18 ENCOUNTER — Emergency Department

## 2024-03-18 ENCOUNTER — Emergency Department
Admission: EM | Admit: 2024-03-18 | Discharge: 2024-03-19 | Disposition: A | Discharge status: Home / Self Care | Attending: Emergency Medicine | Admitting: Emergency Medicine

## 2024-03-18 ENCOUNTER — Other Ambulatory Visit: Payer: Self-pay

## 2024-03-18 DIAGNOSIS — F410 Panic disorder [episodic paroxysmal anxiety] without agoraphobia: Secondary | ICD-10-CM | POA: Diagnosis not present

## 2024-03-18 DIAGNOSIS — D72829 Elevated white blood cell count, unspecified: Secondary | ICD-10-CM | POA: Diagnosis not present

## 2024-03-18 DIAGNOSIS — K625 Hemorrhage of anus and rectum: Secondary | ICD-10-CM | POA: Diagnosis present

## 2024-03-18 LAB — CBC WITH DIFFERENTIAL/PLATELET
Abs Immature Granulocytes: 0.09 K/uL — ABNORMAL HIGH (ref 0.00–0.07)
Basophils Absolute: 0.1 K/uL (ref 0.0–0.1)
Basophils Relative: 1 %
Eosinophils Absolute: 0.4 K/uL (ref 0.0–0.5)
Eosinophils Relative: 3 %
HCT: 41 % (ref 36.0–46.0)
Hemoglobin: 12.8 g/dL (ref 12.0–15.0)
Immature Granulocytes: 1 %
Lymphocytes Relative: 20 %
Lymphs Abs: 2.8 K/uL (ref 0.7–4.0)
MCH: 26.8 pg (ref 26.0–34.0)
MCHC: 31.2 g/dL (ref 30.0–36.0)
MCV: 86 fL (ref 80.0–100.0)
Monocytes Absolute: 1.2 K/uL — ABNORMAL HIGH (ref 0.1–1.0)
Monocytes Relative: 9 %
Neutro Abs: 9.4 K/uL — ABNORMAL HIGH (ref 1.7–7.7)
Neutrophils Relative %: 66 %
Platelets: 292 K/uL (ref 150–400)
RBC: 4.77 MIL/uL (ref 3.87–5.11)
RDW: 14.6 % (ref 11.5–15.5)
WBC: 14 K/uL — ABNORMAL HIGH (ref 4.0–10.5)
nRBC: 0 % (ref 0.0–0.2)

## 2024-03-18 LAB — COMPREHENSIVE METABOLIC PANEL WITH GFR
ALT: 16 U/L (ref 0–44)
AST: 17 U/L (ref 15–41)
Albumin: 3.9 g/dL (ref 3.5–5.0)
Alkaline Phosphatase: 68 U/L (ref 38–126)
Anion gap: 9 (ref 5–15)
BUN: 15 mg/dL (ref 6–20)
CO2: 25 mmol/L (ref 22–32)
Calcium: 9 mg/dL (ref 8.9–10.3)
Chloride: 104 mmol/L (ref 98–111)
Creatinine, Ser: 0.95 mg/dL (ref 0.44–1.00)
GFR, Estimated: >60 mL/min (ref >60)
Glucose, Bld: 87 mg/dL (ref 70–99)
Potassium: 3.6 mmol/L (ref 3.5–5.1)
Sodium: 138 mmol/L (ref 135–145)
Total Bilirubin: 0.7 mg/dL (ref 0.0–1.2)
Total Protein: 6.8 g/dL (ref 6.5–8.1)

## 2024-03-18 LAB — POC URINE PREG, ED: Preg Test, Ur: NEGATIVE

## 2024-03-18 LAB — URINALYSIS, W/ REFLEX TO CULTURE (INFECTION SUSPECTED)
Bacteria, UA: NONE SEEN
Bilirubin Urine: NEGATIVE
Glucose, UA: NEGATIVE mg/dL
Hgb urine dipstick: NEGATIVE
Ketones, ur: NEGATIVE mg/dL
Leukocytes,Ua: NEGATIVE
Nitrite: NEGATIVE
Protein, ur: NEGATIVE mg/dL
Specific Gravity, Urine: 1.027 (ref 1.005–1.030)
pH: 6 (ref 5.0–8.0)

## 2024-03-18 LAB — LIPASE, BLOOD: Lipase: 24 U/L (ref 11–51)

## 2024-03-18 LAB — ETHANOL: Alcohol, Ethyl (B): <15 mg/dL (ref <15)

## 2024-03-18 MED ORDER — MIDAZOLAM HCL 2 MG/2ML IJ SOLN
4.0000 mg | Freq: Once | INTRAMUSCULAR | Status: AC
  Administered 2024-03-18: 4 mg via INTRAVENOUS
  Filled 2024-03-18: qty 4

## 2024-03-18 MED ORDER — SODIUM CHLORIDE 0.9 % IV BOLUS
1000.0000 mL | Freq: Once | INTRAVENOUS | Status: AC
  Administered 2024-03-18: 1000 mL via INTRAVENOUS

## 2024-03-18 MED ORDER — IOHEXOL 300 MG/ML  SOLN
100.0000 mL | Freq: Once | INTRAMUSCULAR | Status: AC | PRN
  Administered 2024-03-18: 100 mL via INTRAVENOUS

## 2024-03-18 MED ORDER — ONDANSETRON HCL 4 MG/2ML IJ SOLN
4.0000 mg | Freq: Once | INTRAMUSCULAR | Status: AC
  Administered 2024-03-18: 4 mg via INTRAVENOUS
  Filled 2024-03-18: qty 2

## 2024-03-18 NOTE — ED Provider Notes (Signed)
 [facility] Provider Note    Event Date/Time   First MD Initiated Contact with Patient 03/18/24 2201     (approximate)   History   Chief Complaint: Rectal Bleeding   HPI  Jasmin Robinson is a 38 y.o. female with a history of anxiety, fibromyalgia, colitis who comes ED complaining of rectal bleeding starting 2 days ago.  Associated with some vague abdominal pain.  No fever.  No vomiting.  No chest pain or shortness of breath.        Past Medical History:  Diagnosis Date   Anxiety    Fibromyalgia    Migraines     Current Outpatient Rx   Order #: [id number] Class: Normal   Order #: [id number] Class: Normal   Order #: [id number] Class: Normal   Order #: [id number] Class: Normal   Order #: [id number] Class: Historical Med   Order #: [id number] Class: Historical Med   Order #: [id number] Class: Normal   Order #: [id number] Class: Normal   Order #: [id number] Class: Normal   Order #: [id number] Class: Normal   Order #: [id number] Class: Normal   Order #: [id number] Class: Normal   Order #: [id number] Class: Historical Med   Order #: [id number] Class: Normal   Order #: [id number] Class: Normal   Order #: [id number] Class: Normal   Order #: [id number] Class: Normal   Order #: [id number] Class: Normal   Order #: [id number] Class: Normal   Order #: [id number] Class: Normal   Order #: [id number] Class: Historical Med   Order #: [id number] Class: Normal   Order #: [id number] Class: Normal   Order #: [id number] Class: Normal   Order #: [id number] Class: Normal   Order #: [id number] Class: Normal   Order #: [id number] Class: Normal   Order #: [id number] Class: Normal   Order #: [id number] Class: Normal   Order #: [id number] Class: Normal   Order #: [id number] Class: Normal   Order #: [id number] Class: Historical Med   Order #: [id number] Class: Normal    Past Surgical History:  Procedure Laterality Date   ABDOMINAL SURGERY     CESAREAN SECTION      Physical Exam   Triage Vital Signs: ED Triage Vitals   Encounter Vitals Group     BP 03/18/24 2154 105/89     Girls Systolic BP Percentile --      Girls Diastolic BP Percentile --      Boys Systolic BP Percentile --      Boys Diastolic BP Percentile --      Pulse Rate 03/18/24 2154 73     Resp 03/18/24 2154 20     Temp 03/18/24 2154 98.1 F (36.7 C)     Temp Source 03/18/24 2154 Oral     SpO2 03/18/24 2154 98 %     Weight 03/18/24 2156 226 lb 10.1 oz (102.8 kg)     Height 03/18/24 2156 5' 7 (1.702 m)     Head Circumference --      Peak Flow --      Pain Score --      Pain Loc --      Pain Education --      Exclude from Growth Chart --     Most recent vital signs: Vitals:   03/18/24 2154  BP: 105/89  Pulse: 73  Resp: 20  Temp: 98.1 F (36.7 C)  SpO2: 98%    General: Awake, no distress.  Anxious and upset CV:  Good peripheral perfusion.  Regular rate rhythm Resp:  Normal effort.  Clear lungs Abd:  No distention.  Soft with left lower quadrant tenderness Other:  Moist oral  mucosa, no pallor   ED Results / Procedures / Treatments   Labs (all labs ordered are listed, but only abnormal results are displayed) Labs Reviewed  CBC WITH DIFFERENTIAL/PLATELET - Abnormal; Notable for the following components:      Result Value   WBC 14.0 (*)    Neutro Abs 9.4 (*)    Monocytes Absolute 1.2 (*)    Abs Immature Granulocytes 0.09 (*)    All other components within normal limits  COMPREHENSIVE METABOLIC PANEL WITH GFR  ETHANOL  LIPASE, BLOOD  URINALYSIS, W/ REFLEX TO CULTURE (INFECTION SUSPECTED)  POC URINE PREG, ED     EKG    RADIOLOGY CT abdomen pelvis pending   PROCEDURES:  Procedures   MEDICATIONS ORDERED IN ED: Medications  sodium chloride  0.9 % bolus 1,000 mL (1,000 mLs Intravenous New Bag/Given 03/18/24 2226)  ondansetron  (ZOFRAN ) injection 4 mg (4 mg Intravenous Given 03/18/24 2226)  midazolam  (VERSED ) injection 4 mg (4 mg Intravenous Given 03/18/24 2226)     IMPRESSION / MDM / ASSESSMENT AND PLAN / ED  COURSE  I reviewed the triage vital signs and the nursing notes.  DDx: Anemia, electrolyte derangement, AKI, alcohol intoxication, pancreatitis, colitis, diverticulitis, ruptured hemorrhoid  Patient's presentation is most consistent with acute presentation with potential threat to life or bodily function.  Patient presents with rectal bleeding, nontoxic with normal vital signs.  Has history of colitis and possible autoimmune disorder.  Labs show leukocytosis of 14,000.  Will obtain CT.  Hemoglobin of 12.8 not consistent with a significant GI bleed.       FINAL CLINICAL IMPRESSION(S) / ED DIAGNOSES   Final diagnoses:  Rectal bleeding     Rx / DC Orders   ED Discharge Orders     None        Note:  This document was prepared using Dragon voice recognition software and may include unintentional dictation errors.   Viviann Pastor, MD 03/18/24 2330

## 2024-03-18 NOTE — ED Triage Notes (Signed)
 Pt arrived by EMS with complaints of rectal bleeding for the past couple of days. Also complaints of lots of anxiety from home.    Pt is ambulatory and AOx4

## 2024-03-19 ENCOUNTER — Other Ambulatory Visit (HOSPITAL_COMMUNITY): Payer: Self-pay

## 2024-03-19 ENCOUNTER — Other Ambulatory Visit: Payer: Self-pay

## 2024-03-19 ENCOUNTER — Emergency Department

## 2024-03-19 ENCOUNTER — Telehealth: Payer: Self-pay

## 2024-03-19 LAB — HCG, QUANTITATIVE, PREGNANCY: hCG, Beta Chain, Quant, S: <1 m[IU]/mL (ref <5)

## 2024-03-19 MED ORDER — BENZONATATE 200 MG PO CAPS
200.0000 mg | ORAL_CAPSULE | Freq: Three times a day (TID) | ORAL | 0 refills | Status: AC | PRN
  Filled 2024-03-19 – 2024-04-19 (×2): qty 30, 10d supply, fill #0

## 2024-03-19 MED ORDER — DOCUSATE SODIUM 100 MG PO CAPS: 100.0000 mg | ORAL_CAPSULE | Freq: Two times a day (BID) | ORAL | 2 refills | Status: AC

## 2024-03-19 MED ORDER — METRONIDAZOLE 0.75 % VA GEL
1.0000 | Freq: Every day | VAGINAL | 0 refills | Status: AC
  Filled 2024-03-19: qty 70, 30d supply, fill #0

## 2024-03-19 MED ORDER — DULOXETINE HCL 60 MG PO CPEP
60.0000 mg | ORAL_CAPSULE | Freq: Every day | ORAL | 1 refills | Status: DC
  Filled 2024-03-19: qty 30, 30d supply, fill #0
  Filled 2024-04-19: qty 30, 30d supply, fill #1

## 2024-03-19 MED ORDER — HYDROXYZINE HCL 25 MG PO TABS
25.0000 mg | ORAL_TABLET | Freq: Two times a day (BID) | ORAL | 0 refills | Status: DC
  Filled 2024-03-19: qty 60, 30d supply, fill #0

## 2024-03-19 MED ORDER — HYDROXYZINE HCL 25 MG PO TABS
25.0000 mg | ORAL_TABLET | Freq: Two times a day (BID) | ORAL | 0 refills | Status: DC | PRN
  Filled 2024-03-19 – 2024-05-16 (×8): qty 60, 30d supply, fill #0

## 2024-03-19 MED ORDER — NORETHINDRONE 0.35 MG PO TABS
1.0000 | ORAL_TABLET | Freq: Every day | ORAL | 3 refills | Status: DC
  Filled 2024-03-19 – 2024-06-18 (×5): qty 84, 84d supply, fill #0

## 2024-03-19 MED ORDER — PREDNISONE 20 MG PO TABS
40.0000 mg | ORAL_TABLET | Freq: Every day | ORAL | 0 refills | Status: AC
  Filled 2024-03-19 – 2024-05-23 (×4): qty 10, 5d supply, fill #0

## 2024-03-19 NOTE — ED Provider Notes (Signed)
-----------------------------------------   12:48 AM on 03/19/2024 ----------------------------------------- Patient care assumed from Dr. Viviann.  Patient's workup shows a reassuring CBC besides slight leukocytosis of 14,000, reassuring chemistry.  Ethanol negative, lipase normal, LFTs are normal.  Patient CT scan has resulting showing no acute finding.  Given the patient's reassuring workup we will discharge home with PCP follow-up.  Patient agreeable to plan of care.   Dorothyann Drivers, MD 03/19/24 [phone]

## 2024-03-19 NOTE — Telephone Encounter (Signed)
 Patient called in with a MH worker . She was tearful as she has a lot going on she has been trying to get her medicines that were prescribed from her ED visit. She has no money and is waiting on disability appt this month. She also needs a case Production designer, theatre/television/film. Spoke to her about calling her insurance company or PCP, as Cm in the hospital are only for hospital patients. [facility] pharmacy that said her preference was listed as mail order, so it was sent to [facility]. Called [facility] and they confirmed that  they had the order. Called [facility] back and they will pull the script from [facility] and  from [facility] to their pharmacy and work with patient on billing at a later date. Called Ms Berquist back to let her know, she expressed understanding and will call [facility] pharmacy to see when they will be ready.

## 2024-03-20 ENCOUNTER — Other Ambulatory Visit (HOSPITAL_COMMUNITY): Payer: Self-pay

## 2024-03-28 ENCOUNTER — Other Ambulatory Visit: Payer: Self-pay

## 2024-03-28 MED ORDER — TIZANIDINE HCL 4 MG PO TABS
4.0000 mg | ORAL_TABLET | Freq: Two times a day (BID) | ORAL | 1 refills | Status: AC | PRN
  Filled 2024-03-28 – 2024-04-19 (×2): qty 30, 15d supply, fill #0
  Filled 2024-05-16 (×7): qty 30, 15d supply, fill #1

## 2024-04-09 ENCOUNTER — Other Ambulatory Visit: Payer: Self-pay

## 2024-04-10 ENCOUNTER — Ambulatory Visit

## 2024-04-19 ENCOUNTER — Other Ambulatory Visit (HOSPITAL_COMMUNITY): Payer: Self-pay

## 2024-04-19 ENCOUNTER — Other Ambulatory Visit: Payer: Self-pay

## 2024-04-20 ENCOUNTER — Other Ambulatory Visit (HOSPITAL_COMMUNITY): Payer: Self-pay

## 2024-05-10 ENCOUNTER — Other Ambulatory Visit (HOSPITAL_COMMUNITY): Payer: Self-pay

## 2024-05-16 ENCOUNTER — Other Ambulatory Visit (HOSPITAL_COMMUNITY): Payer: Self-pay | Admitting: Physician Assistant

## 2024-05-16 ENCOUNTER — Other Ambulatory Visit: Payer: Self-pay

## 2024-05-16 ENCOUNTER — Other Ambulatory Visit (HOSPITAL_BASED_OUTPATIENT_CLINIC_OR_DEPARTMENT_OTHER): Payer: Self-pay

## 2024-05-16 ENCOUNTER — Other Ambulatory Visit (HOSPITAL_COMMUNITY): Payer: Self-pay

## 2024-05-17 ENCOUNTER — Other Ambulatory Visit (HOSPITAL_COMMUNITY): Payer: Self-pay

## 2024-05-17 ENCOUNTER — Other Ambulatory Visit: Payer: Self-pay

## 2024-05-23 ENCOUNTER — Other Ambulatory Visit (HOSPITAL_COMMUNITY): Payer: Self-pay

## 2024-05-24 ENCOUNTER — Other Ambulatory Visit (HOSPITAL_COMMUNITY): Payer: Self-pay

## 2024-05-24 ENCOUNTER — Other Ambulatory Visit: Payer: Self-pay

## 2024-05-24 MED ORDER — HYDROXYZINE HCL 25 MG PO TABS
25.0000 mg | ORAL_TABLET | Freq: Two times a day (BID) | ORAL | 0 refills | Status: AC
  Filled 2024-05-24 – 2024-06-18 (×2): qty 60, 30d supply, fill #0

## 2024-05-28 ENCOUNTER — Other Ambulatory Visit (HOSPITAL_COMMUNITY): Payer: Self-pay

## 2024-05-28 ENCOUNTER — Encounter: Payer: Self-pay | Admitting: Pharmacist

## 2024-05-28 ENCOUNTER — Other Ambulatory Visit: Payer: Self-pay

## 2024-06-04 ENCOUNTER — Other Ambulatory Visit (HOSPITAL_COMMUNITY): Payer: Self-pay

## 2024-06-04 ENCOUNTER — Other Ambulatory Visit: Payer: Self-pay

## 2024-06-04 MED ORDER — TIZANIDINE HCL 4 MG PO TABS
4.0000 mg | ORAL_TABLET | Freq: Two times a day (BID) | ORAL | 0 refills | Status: DC | PRN
  Filled 2024-06-04: qty 60, 30d supply, fill #0
  Filled 2024-06-04: qty 180, 90d supply, fill #0
  Filled 2024-06-04: qty 60, 30d supply, fill #0
  Filled 2024-06-18 – 2024-06-27 (×3): qty 60, 30d supply, fill #1
  Filled 2024-07-11 – 2024-07-21 (×3): qty 60, 30d supply, fill #2

## 2024-06-12 ENCOUNTER — Other Ambulatory Visit (HOSPITAL_COMMUNITY): Payer: Self-pay

## 2024-06-13 ENCOUNTER — Ambulatory Visit: Admission: EM | Admit: 2024-06-13 | Discharge: 2024-06-13 | Disposition: A | Discharge status: Home / Self Care

## 2024-06-13 DIAGNOSIS — B349 Viral infection, unspecified: Secondary | ICD-10-CM

## 2024-06-13 DIAGNOSIS — F17200 Nicotine dependence, unspecified, uncomplicated: Secondary | ICD-10-CM | POA: Insufficient documentation

## 2024-06-13 NOTE — Discharge Instructions (Addendum)
 Most likely you have a viral illness: no antibiotic as indicated at this time, May treat with OTC meds of choice(chloraseptic, Nyquil,Dayquil,etc). Make sure to drink plenty of fluids to stay hydrated(gatorade, water, popsicles,jello,etc), avoid caffeine products. Follow up with PCP.

## 2024-06-13 NOTE — ED Triage Notes (Addendum)
 Sx x 3-4  days  Loss of voice Chest congestion Mouth swelling and spots on tongue.

## 2024-06-13 NOTE — ED Provider Notes (Signed)
 [facility]    CSN: [id number] Arrival date & time: 06/13/24  1929      History   Chief Complaint Chief Complaint  Patient presents with   Nasal Congestion    HPI Jasmin Robinson is a 38 y.o. female.   Jasmin Robinson, 38 year old female, presents to urgent care for evaluation of congestion loss of voice swollen taste buds x 2-3 days. Pt is in no acute distress.  Patient eating well, voiding well  PMH: anxiety, migraines and fibromyalgia, +smoker  The history is provided by the patient. No language interpreter was used.    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   Iron deficiency anemia due to chronic blood loss 01/20/2023   Rectal bleeding 01/20/2023   Night sweats 12/31/2022   Polyarthralgia 12/31/2022   Diarrhea 05/28/2022   Depression 03/11/2022   Seasonal allergies 10/21/2021   Rectal prolapse 08/04/2021   Otitis externa 11/06/2020   Urinary hesitancy 11/06/2020   Fatigue 10/02/2020   Atopic dermatitis 08/28/2020   Generalized abdominal pain 07/21/2020   Mild intermittent asthma, uncomplicated 07/21/2020   Personal history of other mental and behavioral disorders 07/21/2020   Contraception management 07/21/2020   Unspecified hemorrhoids 07/21/2020   Urticaria 07/21/2020   Hyperglycemia 03/29/2019   Increased frequency of urination 03/29/2019   Overweight 03/29/2019   Back pain 03/29/2019   Fibroid 10/14/2017   Pelvic pain in female 10/14/2017   Panic attack 06/06/2016   HSIL on Pap smear of cervix  07/25/2013   Allergic rhinitis 07/19/2013   Anxiety state 05/25/2013   Anxiety 05/25/2013   Major depressive disorder, recurrent episode, moderate (HCC) 05/25/2013   Disorder of teeth and supporting structures 02/02/2013   Attention deficit disorder 01/29/2013   Constipation, unspecified 08/04/2012   Panic disorder 07/21/2012   Smoker 06/20/2012   Asthma 04/20/2011   Status post tubal ligation 04/20/2011    Past Surgical History:  Procedure Laterality Date   ABDOMINAL SURGERY     CESAREAN SECTION      OB History   No obstetric history on file.      Home Medications    Prior to Admission medications   Medication Sig Start Date End Date Taking? Authorizing Provider  gabapentin  (NEURONTIN ) 600 MG tablet Take 1 tablet (600 mg total) by mouth 2 (two) times daily with a meal. 03/01/23  Yes   senna-docusate (SENOKOT-S) 8.6-50 MG tablet Take 1 tablet by mouth daily. 01/04/24  Yes Malvina Alm DASEN, MD  acetaminophen  (TYLENOL ) 500 MG tablet Take 2 tablets (1,000 mg total) by mouth every 6 (six) hours as needed for pain. 07/07/23   Tholemeier, Tinnie SAILOR, MD  albuterol  (VENTOLIN  HFA) 108 (90 Base) MCG/ACT inhaler Inhale 2 puffs into the lungs every 4 (four) hours as needed for wheezing or shortness of breath. 03/03/24   Brimage, Vondra, DO  benzonatate  (TESSALON ) 200 MG capsule Take 1 capsule (200 mg total) by mouth 3 (three) times daily as needed. 12/31/23   Arvis Jolan NOVAK, PA-C  busPIRone  (BUSPAR ) 7.5 MG tablet Take 1  tablet (7.5 mg total) by mouth 2 (two) times daily. 06/22/21   Bernardino Ditch, NP  Cholecalciferol (VITAMIN D3) 10 MCG (400 UNIT) tablet Take 800 Units by mouth daily. 02/10/23   [provider]  docusate sodium  (COLACE) 100 MG capsule Take 1 capsule (100 mg total) by mouth 2 (two) times daily. 03/19/24 03/19/25  Dorothyann Drivers, MD  DULoxetine  (CYMBALTA ) 20 MG capsule Take by mouth.    [provider]  DULoxetine  (CYMBALTA ) 30 MG capsule Take 1 capsule (30 mg total)  by mouth daily for 1 week then increase to 1 capsule twice daily thereafter. 10/12/23     DULoxetine  (CYMBALTA ) 60 MG capsule Take 1 capsule (60 mg total) by mouth daily. 01/12/24     famotidine  (PEPCID ) 20 MG tablet Take 1 tablet (20 mg total) by mouth 2 (two) times daily. 03/24/23     fluticasone  (FLONASE ) 50 MCG/ACT nasal spray Place 2 sprays into both nostrils daily. 01/04/24   Malvina Alm DASEN, MD  gabapentin  (NEURONTIN ) 300 MG capsule Take 1 capsule (300 mg total) by mouth 2-3 times daily. 03/30/23     gabapentin  (NEURONTIN ) 300 MG capsule Take 1 capsule (300 mg total) by mouth 3 (three) times daily. 08/23/23     gabapentin  (NEURONTIN ) 600 MG tablet TAKE 1 TABLET BY MOUTH FOUR TIMES DAILY WITH MEALS 12/03/19   [provider]  hydrocortisone  2.5 % ointment Apply topically to affected area 2 (two) times daily. 04/28/23   Darrel Ozell RAMAN, MD  hydrOXYzine  (ATARAX ) 25 MG tablet Take 1 tablet (25 mg total) by mouth 3 (three) times daily as needed for anxiety. 01/18/22   Paduchowski, Kevin, MD  hydrOXYzine  (ATARAX ) 25 MG tablet Take 1 tablet (25 mg total) by mouth every 12 (twelve) hours as needed. 01/10/24     hydrOXYzine  (ATARAX ) 25 MG tablet Take 1 tablet (25 mg total) by mouth 2 (two) times daily. 05/24/24     ibuprofen  (ADVIL ) 600 MG tablet Take 1 tablet (600 mg total) by mouth every 6 (six) hours as needed for pain. 07/07/23     ipratropium (ATROVENT ) 0.06 % nasal spray Place 2 sprays into both nostrils 4 (four) times daily. 12/31/23   Arvis Huxley B, PA-C  ketoconazole  (NIZORAL ) 2 % shampoo Apply topically twice weekly. 05/19/23   Darrel Ozell RAMAN, MD  lidocaine  (LIDODERM ) 5 % Place 1 patch onto the skin every 12 (twelve) hours. Remove & Discard patch within 12 hours or as directed by MD 11/27/23 11/26/24  Willo Dunnings, MD  medroxyPROGESTERone (DEPO-PROVERA) 150 MG/ML injection Inject into the muscle.    [provider]  meloxicam  (MOBIC ) 15 MG tablet Take 1 tablet (15 mg total) by mouth  daily as needed with a meal. 10/12/23     meloxicam  (MOBIC ) 7.5 MG tablet Take 1 tablet (7.5 mg total) by mouth daily as needed for joint pain. 11/02/23   Alazzam, Hanan, PA  metroNIDAZOLE  (METROGEL ) 0.75 % vaginal gel Place 1 Application vaginally at bedtime for 5 nights. 03/14/24     norethindrone  (MELEYA ) 0.35 MG tablet Take 1 tablet (0.35 mg total) by mouth daily. 03/14/24     omeprazole  (PRILOSEC) 20 MG capsule Take 1 capsule (20 mg total) by mouth daily. 04/25/23   Brimage, Vondra, DO  ondansetron  (ZOFRAN ) 4 MG tablet Take 1 tablet (4 mg total) by mouth every 6 (six) hours. 12/14/23   Bernardino Ditch, NP  oxyCODONE -acetaminophen  (PERCOCET) 5-325 MG tablet Take 1 tablet by mouth every 4 (four) hours as needed. 11/27/23 11/26/24  Willo Dunnings, MD  polyethylene glycol powder (GLYCOLAX /MIRALAX ) 17 GM/SCOOP powder Mix 17 g in liquid and drink mixture once daily. 12/14/23   Bernardino Ditch, NP  predniSONE  (DELTASONE ) 20 MG tablet Take 2 tablets (40 mg total) by mouth daily for 5 days. 12/31/23   Arvis Jolan NOVAK, PA-C  promethazine -dextromethorphan (PROMETHAZINE -DM) 6.25-15 MG/5ML syrup Take 2.5 mLs by mouth 4 (four) times daily as needed. 03/03/24   Brimage, Vondra, DO  Simethicone  125 MG CAPS Take 1 capsule (125 mg total) by mouth 3 (three) times daily with meals. 09/14/23   Dallis Edsel HERO, PA  tiZANidine  (ZANAFLEX ) 4 MG tablet Take 4 mg by mouth 2 (two) times daily as needed.    [provider]  tiZANidine  (ZANAFLEX ) 4 MG tablet Take 1 tablet (4 mg total) by mouth every 12 (twelve) hours as needed. 03/28/24   Alyse Bradley, MD  tiZANidine  (ZANAFLEX ) 4 MG tablet Take 1 tablet (4 mg total) by mouth 2 (two) times daily as needed. 06/04/24     citalopram (CELEXA) 10 MG tablet Take 10 mg by mouth daily.  01/20/19  [provider]  topiramate (TOPAMAX) 25 MG capsule Take 25 mg by mouth 2 (two) times daily.  01/20/19  [provider]    Family History History reviewed. No pertinent family  history.  Social History Social History   Tobacco Use   Smoking status: Former    Types: Cigarettes   Smokeless tobacco: Never  Vaping Use   Vaping status: Some Days  Substance Use Topics   Alcohol use: Yes   Drug use: Never     Allergies   Sulfa antibiotics, Sulfamethoxazole-trimethoprim, Ibuprofen , and Tylenol  [acetaminophen ]   Review of Systems Review of Systems  Constitutional:  Negative for activity change, appetite change and fever.  HENT:  Positive for congestion, mouth sores and voice change.   All other systems reviewed and are negative.    Physical Exam Triage Vital Signs ED Triage Vitals  Encounter Vitals Group     BP 06/13/24 1944 (!) 135/91     Girls Systolic BP Percentile --      Girls Diastolic BP Percentile --      Boys Systolic BP Percentile --      Boys Diastolic BP Percentile --      Pulse Rate 06/13/24 1944 83     Resp 06/13/24 1944 18     Temp 06/13/24 1944 99.2 F (37.3 C)     Temp Source 06/13/24 1944 Oral     SpO2 06/13/24 1944 95 %     Weight 06/13/24 1945 228 lb 8 oz (103.6 kg)     Height --      Head Circumference --      Peak Flow --      Pain Score 06/13/24 1941 10     Pain Loc --      Pain Education --      Exclude from Growth Chart --    No data found.  Updated Vital Signs BP (!) 135/91 (BP Location: Right Arm)   Pulse 83   Temp 99.2 F (37.3 C) (Oral)   Resp 18   Wt 228 lb 8 oz (103.6 kg)   SpO2 95%   BMI 35.79 kg/m   Visual Acuity Right Eye Distance:   Left Eye Distance:   Bilateral Distance:    Right Eye Near:   Left Eye Near:    Bilateral Near:     Physical Exam Vitals and nursing note reviewed.  Constitutional:      General: She is not in acute distress.    Appearance: She is well-developed and well-groomed.  HENT:     Head: Normocephalic and atraumatic.     Right Ear: Tympanic membrane is retracted.     Left Ear: Tympanic membrane is retracted.     Nose: Congestion present.     Mouth/Throat:      Lips: Pink.     Mouth: Mucous membranes are moist.     Tongue: Lesions present.   Eyes:     Conjunctiva/sclera: Conjunctivae normal.  Cardiovascular:     Rate and Rhythm: Normal rate and regular rhythm.     Heart sounds: No murmur heard. Pulmonary:     Effort: Pulmonary effort is normal. No respiratory distress.     Breath sounds: Normal breath sounds.  Abdominal:     Palpations: Abdomen is soft.     Tenderness: There is no abdominal tenderness.  Musculoskeletal:        General: No swelling.     Cervical back: Neck supple.  Skin:    General: Skin is warm and dry.     Capillary Refill: Capillary refill takes less than 2 seconds.  Neurological:     General: No focal deficit present.     Mental Status: She is alert and oriented to person, place, and time.     GCS: GCS eye subscore is 4. GCS verbal subscore is 5. GCS motor subscore is 6.  Psychiatric:        Attention and Perception: Attention normal.        Mood and Affect: Mood normal.        Speech: Speech normal.        Behavior: Behavior normal. Behavior is cooperative.      UC Treatments / Results  Labs (all labs ordered are listed, but only abnormal results are displayed) Labs Reviewed - No data to display  EKG   Radiology No results found.  Procedures Procedures (including critical care time)  Medications Ordered in UC Medications - No data to display  Initial Impression / Assessment and Plan / UC Course  I have reviewed the triage vital signs and the nursing notes.  Pertinent labs & imaging results that were available during my care of the patient were reviewed by me and considered in my medical decision making (see chart for details).    Discussed exam findings and plan of care with patient, smoking cessation, strict go to ER precautions given.   Patient verbalized understanding to this provider.  Ddx: Viral illness,laryngitis, mouth ulcer, allergies Final Clinical Impressions(s) / UC Diagnoses    Final diagnoses:  Nonspecific syndrome suggestive of viral illness  Smoker     Discharge Instructions      Most likely you have a viral illness: no antibiotic as indicated at this time, May treat with OTC meds of choice(chloraseptic, Nyquil,Dayquil,etc). Make sure to drink plenty of fluids to stay hydrated(gatorade, water, popsicles,jello,etc), avoid caffeine products. Follow up with PCP.      ED Prescriptions   None    PDMP not reviewed this encounter.   Aminta Loose, NP 06/13/24 2011

## 2024-06-18 ENCOUNTER — Other Ambulatory Visit (HOSPITAL_COMMUNITY): Payer: Self-pay

## 2024-06-18 ENCOUNTER — Other Ambulatory Visit: Payer: Self-pay

## 2024-06-18 MED ORDER — LINZESS 145 MCG PO CAPS
145.0000 ug | ORAL_CAPSULE | Freq: Every day | ORAL | 0 refills | Status: AC
  Filled 2024-06-18: qty 30, 30d supply, fill #0

## 2024-06-18 MED ORDER — OMEPRAZOLE 40 MG PO CPDR
40.0000 mg | DELAYED_RELEASE_CAPSULE | Freq: Every day | ORAL | 1 refills | Status: DC
  Filled 2024-06-18: qty 30, 30d supply, fill #0
  Filled 2024-06-27 – 2024-07-11 (×2): qty 30, 30d supply, fill #1

## 2024-06-19 ENCOUNTER — Other Ambulatory Visit: Payer: Self-pay

## 2024-06-20 ENCOUNTER — Other Ambulatory Visit (HOSPITAL_COMMUNITY): Payer: Self-pay

## 2024-06-20 ENCOUNTER — Encounter: Payer: Self-pay | Admitting: Pharmacist

## 2024-06-20 ENCOUNTER — Other Ambulatory Visit: Payer: Self-pay

## 2024-06-21 ENCOUNTER — Other Ambulatory Visit (HOSPITAL_COMMUNITY): Payer: Self-pay

## 2024-06-21 MED ORDER — HYDROXYZINE HCL 25 MG PO TABS
25.0000 mg | ORAL_TABLET | Freq: Two times a day (BID) | ORAL | 0 refills | Status: DC | PRN
  Filled 2024-06-27 – 2024-07-17 (×3): qty 60, 30d supply, fill #0

## 2024-06-25 ENCOUNTER — Other Ambulatory Visit: Payer: Self-pay

## 2024-06-27 ENCOUNTER — Other Ambulatory Visit: Payer: Self-pay

## 2024-06-27 ENCOUNTER — Other Ambulatory Visit (HOSPITAL_COMMUNITY): Payer: Self-pay

## 2024-06-28 ENCOUNTER — Other Ambulatory Visit: Payer: Self-pay

## 2024-06-29 ENCOUNTER — Other Ambulatory Visit: Payer: Self-pay

## 2024-06-29 ENCOUNTER — Other Ambulatory Visit (HOSPITAL_COMMUNITY): Payer: Self-pay

## 2024-07-11 ENCOUNTER — Other Ambulatory Visit (HOSPITAL_COMMUNITY): Payer: Self-pay

## 2024-07-11 ENCOUNTER — Other Ambulatory Visit (HOSPITAL_COMMUNITY): Payer: Self-pay | Admitting: Physician Assistant

## 2024-07-11 ENCOUNTER — Other Ambulatory Visit: Payer: Self-pay

## 2024-07-11 MED ORDER — LINZESS 145 MCG PO CAPS
ORAL_CAPSULE | ORAL | 1 refills | Status: AC
  Filled 2024-07-11: qty 90, 90d supply, fill #0
  Filled 2024-08-12: qty 90, 90d supply, fill #1

## 2024-07-17 ENCOUNTER — Other Ambulatory Visit (HOSPITAL_COMMUNITY): Payer: Self-pay

## 2024-07-17 ENCOUNTER — Other Ambulatory Visit: Payer: Self-pay

## 2024-07-17 MED ORDER — DULOXETINE HCL 60 MG PO CPEP
60.0000 mg | ORAL_CAPSULE | Freq: Every day | ORAL | 1 refills | Status: AC
  Filled 2024-07-17 (×2): qty 30, 30d supply, fill #0
  Filled 2024-07-24 – 2024-08-12 (×2): qty 30, 30d supply, fill #1

## 2024-07-17 MED ORDER — MELOXICAM 15 MG PO TABS
15.0000 mg | ORAL_TABLET | Freq: Every day | ORAL | 2 refills | Status: AC | PRN
  Filled 2024-07-17 (×2): qty 30, 30d supply, fill #0
  Filled 2024-07-21 – 2024-08-12 (×3): qty 30, 30d supply, fill #1

## 2024-07-17 MED ORDER — METHOCARBAMOL 750 MG PO TABS
750.0000 mg | ORAL_TABLET | ORAL | 0 refills | Status: DC | PRN
  Filled 2024-07-17 (×2): qty 90, 30d supply, fill #0

## 2024-07-18 ENCOUNTER — Other Ambulatory Visit: Payer: Self-pay

## 2024-07-19 ENCOUNTER — Other Ambulatory Visit: Payer: Self-pay

## 2024-07-19 ENCOUNTER — Other Ambulatory Visit (HOSPITAL_COMMUNITY): Payer: Self-pay

## 2024-07-19 MED ORDER — HYDROXYZINE HCL 25 MG PO TABS
25.0000 mg | ORAL_TABLET | Freq: Three times a day (TID) | ORAL | 2 refills | Status: AC | PRN
  Filled 2024-07-19 (×2): qty 90, 30d supply, fill #0

## 2024-07-19 MED ORDER — OMEPRAZOLE 40 MG PO CPDR
40.0000 mg | DELAYED_RELEASE_CAPSULE | Freq: Every day | ORAL | 3 refills | Status: AC
  Filled 2024-07-19 – 2024-08-06 (×5): qty 90, 90d supply, fill #0

## 2024-07-20 ENCOUNTER — Other Ambulatory Visit: Payer: Self-pay

## 2024-07-21 ENCOUNTER — Other Ambulatory Visit (HOSPITAL_COMMUNITY): Payer: Self-pay

## 2024-07-21 ENCOUNTER — Other Ambulatory Visit: Payer: Self-pay

## 2024-07-22 ENCOUNTER — Other Ambulatory Visit: Payer: Self-pay

## 2024-07-24 ENCOUNTER — Other Ambulatory Visit: Payer: Self-pay

## 2024-07-24 ENCOUNTER — Other Ambulatory Visit (HOSPITAL_COMMUNITY): Payer: Self-pay

## 2024-07-25 ENCOUNTER — Other Ambulatory Visit: Payer: Self-pay

## 2024-07-30 ENCOUNTER — Other Ambulatory Visit (HOSPITAL_COMMUNITY): Payer: Self-pay

## 2024-08-02 ENCOUNTER — Other Ambulatory Visit (HOSPITAL_COMMUNITY): Payer: Self-pay

## 2024-08-06 ENCOUNTER — Other Ambulatory Visit: Payer: Self-pay

## 2024-08-06 ENCOUNTER — Other Ambulatory Visit (HOSPITAL_COMMUNITY): Payer: Self-pay

## 2024-08-06 MED ORDER — ONDANSETRON 4 MG PO TBDP
4.0000 mg | ORAL_TABLET | Freq: Three times a day (TID) | ORAL | 0 refills | Status: AC | PRN
  Filled 2024-08-06 (×2): qty 20, 7d supply, fill #0

## 2024-08-12 ENCOUNTER — Other Ambulatory Visit (HOSPITAL_COMMUNITY): Payer: Self-pay

## 2024-08-13 ENCOUNTER — Other Ambulatory Visit: Payer: Self-pay

## 2024-08-13 MED ORDER — POLYETHYLENE GLYCOL 3350 17 GM/SCOOP PO POWD
ORAL | 0 refills | Status: AC
  Filled 2024-08-13: qty 238, 14d supply, fill #0
  Filled 2024-08-15: qty 238, 15d supply, fill #0

## 2024-08-13 MED ORDER — BISACODYL EC 5 MG PO TBEC
DELAYED_RELEASE_TABLET | ORAL | 0 refills | Status: AC
  Filled 2024-08-13 – 2024-08-15 (×2): qty 2, 1d supply, fill #0

## 2024-08-13 MED ORDER — PEG-3350/ELECTROLYTES 236 G PO SOLR
ORAL | 0 refills | Status: AC
  Filled 2024-08-13 – 2024-08-15 (×3): qty 4000, 1d supply, fill #0

## 2024-08-15 ENCOUNTER — Other Ambulatory Visit: Payer: Self-pay

## 2024-08-15 MED ORDER — TIZANIDINE HCL 4 MG PO TABS
4.0000 mg | ORAL_TABLET | Freq: Two times a day (BID) | ORAL | 0 refills | Status: AC | PRN
  Filled 2024-08-15 (×2): qty 68, 34d supply, fill #0
  Filled 2024-08-15: qty 60, 30d supply, fill #0

## 2024-08-15 MED ORDER — METHOCARBAMOL 750 MG PO TABS
750.0000 mg | ORAL_TABLET | Freq: Three times a day (TID) | ORAL | 0 refills | Status: AC | PRN
  Filled 2024-08-15 (×3): qty 90, 30d supply, fill #0

## 2024-08-16 ENCOUNTER — Other Ambulatory Visit (HOSPITAL_COMMUNITY): Payer: Self-pay
